# Patient Record
Sex: Female | Born: 1940 | Race: White | Hispanic: No | Marital: Married | Smoking: Never smoker
Health system: Southern US, Community
[De-identification: ages and names within clinical notes are randomized; demographics above are authoritative.]

## PROBLEM LIST (undated history)

## (undated) DIAGNOSIS — T4145XA Adverse effect of unspecified anesthetic, initial encounter: Secondary | ICD-10-CM

## (undated) DIAGNOSIS — K219 Gastro-esophageal reflux disease without esophagitis: Secondary | ICD-10-CM

## (undated) DIAGNOSIS — M8580 Other specified disorders of bone density and structure, unspecified site: Secondary | ICD-10-CM

## (undated) DIAGNOSIS — R52 Pain, unspecified: Secondary | ICD-10-CM

## (undated) DIAGNOSIS — E119 Type 2 diabetes mellitus without complications: Secondary | ICD-10-CM

## (undated) DIAGNOSIS — E785 Hyperlipidemia, unspecified: Secondary | ICD-10-CM

## (undated) DIAGNOSIS — H9190 Unspecified hearing loss, unspecified ear: Secondary | ICD-10-CM

## (undated) DIAGNOSIS — Z86010 Personal history of colonic polyps: Secondary | ICD-10-CM

## (undated) DIAGNOSIS — I10 Essential (primary) hypertension: Secondary | ICD-10-CM

## (undated) DIAGNOSIS — F419 Anxiety disorder, unspecified: Secondary | ICD-10-CM

## (undated) DIAGNOSIS — M1990 Unspecified osteoarthritis, unspecified site: Secondary | ICD-10-CM

## (undated) DIAGNOSIS — K5790 Diverticulosis of intestine, part unspecified, without perforation or abscess without bleeding: Secondary | ICD-10-CM

## (undated) DIAGNOSIS — K760 Fatty (change of) liver, not elsewhere classified: Secondary | ICD-10-CM

## (undated) DIAGNOSIS — T8859XA Other complications of anesthesia, initial encounter: Secondary | ICD-10-CM

## (undated) HISTORY — PX: JOINT REPLACEMENT: SHX530

## (undated) HISTORY — PX: TONSILLECTOMY: SUR1361

## (undated) HISTORY — DX: Hyperlipidemia, unspecified: E78.5

## (undated) HISTORY — DX: Unspecified hearing loss, unspecified ear: H91.90

## (undated) HISTORY — DX: Personal history of colonic polyps: Z86.010

## (undated) HISTORY — PX: APPENDECTOMY: SHX54

## (undated) HISTORY — DX: Other specified disorders of bone density and structure, unspecified site: M85.80

## (undated) HISTORY — DX: Fatty (change of) liver, not elsewhere classified: K76.0

## (undated) HISTORY — DX: Diverticulosis of intestine, part unspecified, without perforation or abscess without bleeding: K57.90

## (undated) HISTORY — DX: Anxiety disorder, unspecified: F41.9

## (undated) HISTORY — PX: ABDOMINAL HYSTERECTOMY: SHX81

## (undated) HISTORY — DX: Type 2 diabetes mellitus without complications: E11.9

---

## 1998-05-01 ENCOUNTER — Ambulatory Visit: Admission: RE | Admit: 1998-05-01 | Discharge: 1998-05-01 | Payer: Self-pay | Admitting: Internal Medicine

## 2001-04-15 ENCOUNTER — Other Ambulatory Visit: Admission: RE | Admit: 2001-04-15 | Discharge: 2001-04-15 | Payer: Self-pay | Admitting: Obstetrics & Gynecology

## 2002-04-18 ENCOUNTER — Other Ambulatory Visit: Admission: RE | Admit: 2002-04-18 | Discharge: 2002-04-18 | Payer: Self-pay | Admitting: Obstetrics & Gynecology

## 2004-05-26 ENCOUNTER — Other Ambulatory Visit: Admission: RE | Admit: 2004-05-26 | Discharge: 2004-05-26 | Payer: Self-pay | Admitting: Obstetrics & Gynecology

## 2006-08-26 ENCOUNTER — Ambulatory Visit: Payer: Self-pay | Admitting: Internal Medicine

## 2006-09-29 ENCOUNTER — Ambulatory Visit: Payer: Self-pay | Admitting: Internal Medicine

## 2009-08-02 ENCOUNTER — Inpatient Hospital Stay (HOSPITAL_COMMUNITY): Admission: RE | Admit: 2009-08-02 | Discharge: 2009-08-06 | Payer: Self-pay | Admitting: Orthopedic Surgery

## 2010-01-24 ENCOUNTER — Inpatient Hospital Stay (HOSPITAL_COMMUNITY): Admission: EM | Admit: 2010-01-24 | Discharge: 2010-01-30 | Payer: Self-pay | Admitting: Emergency Medicine

## 2010-03-10 ENCOUNTER — Inpatient Hospital Stay (HOSPITAL_COMMUNITY): Admission: RE | Admit: 2010-03-10 | Discharge: 2010-03-13 | Payer: Self-pay | Admitting: Orthopedic Surgery

## 2010-04-17 ENCOUNTER — Encounter: Admission: RE | Admit: 2010-04-17 | Discharge: 2010-04-17 | Payer: Self-pay | Admitting: Internal Medicine

## 2010-04-17 DIAGNOSIS — K760 Fatty (change of) liver, not elsewhere classified: Secondary | ICD-10-CM

## 2010-04-17 HISTORY — DX: Fatty (change of) liver, not elsewhere classified: K76.0

## 2010-08-21 LAB — URINALYSIS, ROUTINE W REFLEX MICROSCOPIC
Bilirubin Urine: NEGATIVE
Glucose, UA: 100 mg/dL — AB
Glucose, UA: 100 mg/dL — AB
Hgb urine dipstick: NEGATIVE
Ketones, ur: NEGATIVE mg/dL
Nitrite: NEGATIVE
Specific Gravity, Urine: 1.013 (ref 1.005–1.030)
Specific Gravity, Urine: 1.025 (ref 1.005–1.030)
Urobilinogen, UA: 0.2 mg/dL (ref 0.0–1.0)
pH: 7.5 (ref 5.0–8.0)
pH: 6.5 (ref 5.0–8.0)

## 2010-08-21 LAB — BASIC METABOLIC PANEL
BUN: 7 mg/dL (ref 6–23)
CO2: 26 mEq/L (ref 19–32)
CO2: 26 mEq/L (ref 19–32)
Calcium: 8.5 mg/dL (ref 8.4–10.5)
Chloride: 106 mEq/L (ref 96–112)
Chloride: 107 mEq/L (ref 96–112)
Creatinine, Ser: 0.72 mg/dL (ref 0.4–1.2)
Glucose, Bld: 104 mg/dL — ABNORMAL HIGH (ref 70–99)
Sodium: 139 mEq/L (ref 135–145)

## 2010-08-21 LAB — CBC
HCT: 31.9 % — ABNORMAL LOW (ref 36.0–46.0)
Hemoglobin: 10.9 g/dL — ABNORMAL LOW (ref 12.0–15.0)
Hemoglobin: 11 g/dL — ABNORMAL LOW (ref 12.0–15.0)
Hemoglobin: 14.4 g/dL (ref 12.0–15.0)
MCH: 32.8 pg (ref 26.0–34.0)
MCH: 33.7 pg (ref 26.0–34.0)
MCHC: 34.8 g/dL (ref 30.0–36.0)
MCV: 94.3 fL (ref 78.0–100.0)
MCV: 96.9 fL (ref 78.0–100.0)
Platelets: 210 10*3/uL (ref 150–400)
Platelets: 234 10*3/uL (ref 150–400)
Platelets: 343 10*3/uL (ref 150–400)
RBC: 3.35 MIL/uL — ABNORMAL LOW (ref 3.87–5.11)
RBC: 3.38 MIL/uL — ABNORMAL LOW (ref 3.87–5.11)
RBC: 4.45 MIL/uL (ref 3.87–5.11)
RDW: 13.3 % (ref 11.5–15.5)
WBC: 8.9 10*3/uL (ref 4.0–10.5)
WBC: 8.5 10*3/uL (ref 4.0–10.5)

## 2010-08-21 LAB — COMPREHENSIVE METABOLIC PANEL
ALT: 21 U/L (ref 0–35)
AST: 19 U/L (ref 0–37)
Albumin: 3.8 g/dL (ref 3.5–5.2)
CO2: 25 mEq/L (ref 19–32)
Calcium: 9.3 mg/dL (ref 8.4–10.5)
Chloride: 104 mEq/L (ref 96–112)
Creatinine, Ser: 0.78 mg/dL (ref 0.4–1.2)
GFR calc Af Amer: >60 mL/min (ref >60)
GFR calc non Af Amer: >60 mL/min (ref >60)
Sodium: 138 mEq/L (ref 135–145)
Total Bilirubin: 0.4 mg/dL (ref 0.3–1.2)

## 2010-08-21 LAB — PROTIME-INR
INR: 1.55 — ABNORMAL HIGH (ref 0.00–1.49)
Prothrombin Time: 18.8 seconds — ABNORMAL HIGH (ref 11.6–15.2)
Prothrombin Time: 20.4 seconds — ABNORMAL HIGH (ref 11.6–15.2)

## 2010-08-21 LAB — URINE MICROSCOPIC-ADD ON

## 2010-08-21 LAB — SURGICAL PCR SCREEN: MRSA, PCR: NEGATIVE

## 2010-08-21 LAB — APTT: aPTT: 27 seconds (ref 24–37)

## 2010-08-22 LAB — CBC
Platelets: 287 10*3/uL (ref 150–400)
RBC: 4.75 MIL/uL (ref 3.87–5.11)
RDW: 13 % (ref 11.5–15.5)
WBC: 14.5 10*3/uL — ABNORMAL HIGH (ref 4.0–10.5)

## 2010-08-22 LAB — PROTIME-INR
INR: 0.93 (ref 0.00–1.49)
Prothrombin Time: 12.7 seconds (ref 11.6–15.2)

## 2010-08-28 LAB — APTT: aPTT: 25 seconds (ref 24–37)

## 2010-08-28 LAB — URINALYSIS, ROUTINE W REFLEX MICROSCOPIC
Ketones, ur: NEGATIVE mg/dL
Nitrite: NEGATIVE
Protein, ur: NEGATIVE mg/dL
Urobilinogen, UA: 0.2 mg/dL (ref 0.0–1.0)

## 2010-08-28 LAB — PROTIME-INR
INR: 0.92 (ref 0.00–1.49)
INR: 1.58 — ABNORMAL HIGH (ref 0.00–1.49)
Prothrombin Time: 18.7 seconds — ABNORMAL HIGH (ref 11.6–15.2)

## 2010-08-28 LAB — BASIC METABOLIC PANEL
BUN: 6 mg/dL (ref 6–23)
CO2: 27 mEq/L (ref 19–32)
Calcium: 8.8 mg/dL (ref 8.4–10.5)
Chloride: 103 mEq/L (ref 96–112)
Creatinine, Ser: 0.78 mg/dL (ref 0.4–1.2)
Glucose, Bld: 114 mg/dL — ABNORMAL HIGH (ref 70–99)
Glucose, Bld: 204 mg/dL — ABNORMAL HIGH (ref 70–99)
Potassium: 3.5 mEq/L (ref 3.5–5.1)

## 2010-08-28 LAB — CBC
HCT: 34.7 % — ABNORMAL LOW (ref 36.0–46.0)
HCT: 38.7 % (ref 36.0–46.0)
HCT: 41.5 % (ref 36.0–46.0)
Hemoglobin: 11.8 g/dL — ABNORMAL LOW (ref 12.0–15.0)
MCHC: 34.7 g/dL (ref 30.0–36.0)
MCHC: 34.7 g/dL (ref 30.0–36.0)
MCV: 94.8 fL (ref 78.0–100.0)
Platelets: 199 10*3/uL (ref 150–400)
Platelets: 225 10*3/uL (ref 150–400)
Platelets: 259 10*3/uL (ref 150–400)
RDW: 12.6 % (ref 11.5–15.5)
RDW: 12.7 % (ref 11.5–15.5)
RDW: 12.8 % (ref 11.5–15.5)
RDW: 12.8 % (ref 11.5–15.5)
WBC: 6.3 10*3/uL (ref 4.0–10.5)

## 2010-08-28 LAB — COMPREHENSIVE METABOLIC PANEL
Albumin: 3.5 g/dL (ref 3.5–5.2)
BUN: 15 mg/dL (ref 6–23)
Chloride: 107 mEq/L (ref 96–112)
Creatinine, Ser: 0.74 mg/dL (ref 0.4–1.2)
GFR calc non Af Amer: >60 mL/min (ref >60)
Total Bilirubin: 0.5 mg/dL (ref 0.3–1.2)

## 2010-09-01 LAB — PROTIME-INR: Prothrombin Time: 21.3 seconds — ABNORMAL HIGH (ref 11.6–15.2)

## 2012-04-26 ENCOUNTER — Other Ambulatory Visit: Payer: Self-pay | Admitting: Orthopedic Surgery

## 2012-04-26 MED ORDER — DEXAMETHASONE SODIUM PHOSPHATE 10 MG/ML IJ SOLN: 10.0000 mg | Freq: Once | INTRAMUSCULAR | Status: DC

## 2012-04-26 NOTE — Progress Notes (Signed)
Preoperative surgical orders have been place into the Epic hospital system for Orland Dec on 04/26/2012, 11:23 AM  by Patrica Duel for surgery on 05/25/12.  Preop Knee Scope orders including IV Tylenol and IV Decadron as long as there are no contraindications to the above medications. Avel Peace, PA-C

## 2012-05-23 ENCOUNTER — Encounter (HOSPITAL_COMMUNITY): Payer: Self-pay

## 2012-05-23 ENCOUNTER — Encounter (HOSPITAL_COMMUNITY)
Admission: RE | Admit: 2012-05-23 | Discharge: 2012-05-23 | Disposition: A | Discharge status: Home / Self Care | Payer: Medicare Other | Source: Ambulatory Visit | Attending: Orthopedic Surgery | Admitting: Orthopedic Surgery

## 2012-05-23 ENCOUNTER — Encounter (HOSPITAL_COMMUNITY): Payer: Self-pay | Admitting: Pharmacy Technician

## 2012-05-23 HISTORY — DX: Other complications of anesthesia, initial encounter: T88.59XA

## 2012-05-23 HISTORY — DX: Unspecified osteoarthritis, unspecified site: M19.90

## 2012-05-23 HISTORY — DX: Pain, unspecified: R52

## 2012-05-23 HISTORY — DX: Adverse effect of unspecified anesthetic, initial encounter: T41.45XA

## 2012-05-23 HISTORY — DX: Gastro-esophageal reflux disease without esophagitis: K21.9

## 2012-05-23 HISTORY — DX: Essential (primary) hypertension: I10

## 2012-05-23 LAB — BASIC METABOLIC PANEL WITH GFR
BUN: 19 mg/dL (ref 6–23)
CO2: 26 meq/L (ref 19–32)
Calcium: 10.4 mg/dL (ref 8.4–10.5)
Chloride: 101 meq/L (ref 96–112)
Creatinine, Ser: 0.75 mg/dL (ref 0.50–1.10)
GFR calc Af Amer: >90 mL/min
GFR calc non Af Amer: 82 mL/min — ABNORMAL LOW
Glucose, Bld: 106 mg/dL — ABNORMAL HIGH (ref 70–99)
Potassium: 3.7 meq/L (ref 3.5–5.1)
Sodium: 139 meq/L (ref 135–145)

## 2012-05-23 LAB — SURGICAL PCR SCREEN
MRSA, PCR: NEGATIVE
Staphylococcus aureus: POSITIVE — AB

## 2012-05-23 NOTE — Patient Instructions (Signed)
YOUR SURGERY IS SCHEDULED AT [facility]  ON:  WED  12/18  REPORT TO [facility] AT:  11:00 AM      PHONE # FOR SHORT STAY IS [phone]  DO NOT EAT  ANYTHING AFTER MIDNIGHT THE NIGHT BEFORE YOUR SURGERY.  NO FOOD, NO CHEWING GUM, NO MINTS, NO CANDIES, NO CHEWING TOBACCO. YOU MAY HAVE CLEAR LIQUIDS TO DRINK FROM MIDNIGHT THE NIGHT BEFORE SURGERY UNTIL 7:30 AM DAY OF SURGERY --LIKE WATER, TEA, APPLE, GRAPE OR CRANBERRY JUICE.  NOTHING TO DRINK AFTER 7:30 AM.  PLEASE TAKE THE FOLLOWING MEDICATIONS THE AM OF YOUR SURGERY WITH A FEW SIPS OF WATER:   AMLODIPINE AND PRAVASTATIN.  IF YOU USE INHALERS--USE YOUR INHALERS THE AM OF YOUR SURGERY AND BRING INHALERS TO THE HOSPITAL -TAKE TO SURGERY.    IF YOU ARE DIABETIC:  DO NOT TAKE ANY DIABETIC MEDICATIONS THE AM OF YOUR SURGERY.  IF YOU TAKE INSULIN IN THE EVENINGS--PLEASE ONLY TAKE 1/2 NORMAL EVENING DOSE THE NIGHT BEFORE YOUR SURGERY.  NO INSULIN THE AM OF YOUR SURGERY.  IF YOU HAVE SLEEP APNEA AND USE CPAP OR BIPAP--PLEASE BRING THE MASK AND THE TUBING.  DO NOT BRING YOUR MACHINE.  DO NOT BRING VALUABLES, MONEY, CREDIT CARDS.  DO NOT WEAR JEWELRY, MAKE-UP, NAIL POLISH AND NO METAL PINS OR CLIPS IN YOUR HAIR. CONTACT LENS, DENTURES / PARTIALS, GLASSES SHOULD NOT BE WORN TO SURGERY AND IN MOST CASES-HEARING AIDS WILL NEED TO BE REMOVED.  BRING YOUR GLASSES CASE, ANY EQUIPMENT NEEDED FOR YOUR CONTACT LENS. FOR PATIENTS ADMITTED TO THE HOSPITAL--CHECK OUT TIME THE DAY OF DISCHARGE IS 11:00 AM.  ALL INPATIENT ROOMS ARE PRIVATE - WITH BATHROOM, TELEPHONE, TELEVISION AND WIFI INTERNET.  IF YOU ARE BEING DISCHARGED THE SAME DAY OF YOUR SURGERY--YOU CAN NOT DRIVE YOURSELF HOME--AND SHOULD NOT GO HOME ALONE BY TAXI OR BUS.  NO DRIVING OR OPERATING MACHINERY FOR 24 HOURS FOLLOWING ANESTHESIA / PAIN MEDICATIONS.  PLEASE MAKE ARRANGEMENTS FOR SOMEONE TO BE WITH YOU AT HOME THE FIRST 24 HOURS AFTER SURGERY. RESPONSIBLE DRIVER'S NAME   TED Nicol   [phone]                                                                            PLEASE READ OVER ANY  FACT SHEETS THAT YOU WERE GIVEN: MRSA INFORMATION, BLOOD TRANSFUSION INFORMATION, INCENTIVE SPIROMETER INFORMATION. FAILURE TO FOLLOW THESE INSTRUCTIONS MAY RESULT IN THE CANCELLATION OF YOUR SURGERY.   PATIENT SIGNATURE_________________________________

## 2012-05-23 NOTE — Pre-Procedure Instructions (Addendum)
PT HAS CXR REPORT FROM DR. TISOVEC DONE 11/13/11-REPORT ON HER CHART. CBC, BMET WERE DONE PREOP TODAY AT [facility] - AS PER ANESTHESIOLOGIST'S GUIDELINES. PT STATES EKG WAS ALSO DONE AT DR. TISOVEC'S OFFICE ON 11/13/11--REPORT WILL BE REQUESTED--DR. TISOVEC'S OFFICE SENT AN EKG THAT WAS DONE 11/07/10.  EKG NOT REPEATED TODAY.

## 2012-05-24 NOTE — H&P (Signed)
CC- Paige [name] is a 71 y.o. female who presents with left knee pain.  HPI- . Knee Pain: Patient presents with knee pain involving the  left knee. Onset of the symptoms was several months ago. Inciting event: none known. Current symptoms include crepitus sensation. Pain is aggravated by going up and down stairs and rising after sitting.  Patient has had prior knee problems. Evaluation to date: plain films: normal. Treatment to date: none.  Past Medical History  Diagnosis Date  . Hypertension   . GERD (gastroesophageal reflux disease)     PAST HX--NO PROBLEMS NOW-WATCHES DIET  . Arthritis   . Pain     LEFT KNEE  . Complication of anesthesia     PT STATES THAT SHE WAS ADVISED NOT TO HAVE SPINAL ANESTHESIA FOR HER SECOND KNEE REPLACEMENT SURGERY IN  2011-BECAUSE SHE HAD BACK PROBLEMS-PAIN AND SEVERAL EPIDURALS--STATES DR. Shon Baton ADVISED AGAINST SPINAL .  PT STATES SHE HAD SORE THROAT FOR 3 DAYS AFTER THE GENERAL ANESTHESIA FOR  LAST KNEE REPLACEMENT    Past Surgical History  Procedure Date  . Joint replacement     BILATERAL KNEE REPLACEMENTS  . Abdominal hysterectomy   . Appendectomy   . Tonsillectomy     AND ADNOIDECTOMY    Prior to Admission medications   Medication Sig Start Date End Date Taking? Authorizing Provider  amLODipine (NORVASC) 5 MG tablet Take 5 mg by mouth daily before breakfast.    Historical Provider, MD  aspirin EC 81 MG tablet Take 81 mg by mouth daily.    Historical Provider, MD  B Complex-C (B-COMPLEX WITH VITAMIN C) tablet Take 1 tablet by mouth daily.    Historical Provider, MD  calcium-vitamin D (OSCAL WITH D) 500-200 MG-UNIT per tablet Take 1 tablet by mouth daily.    Historical Provider, MD  co-enzyme Q-10 30 MG capsule Take 30 mg by mouth daily.     Historical Provider, MD  diphenhydrAMINE (BENADRYL) 25 MG tablet Take 25 mg by mouth. ONE EVERY NIGHT    Historical Provider, MD  diphenhydrAMINE (SOMINEX) 25 MG tablet Take 25 mg by mouth at bedtime as  needed.    Historical Provider, MD  glucosamine-chondroitin 500-400 MG tablet Take 1 tablet by mouth daily.    Historical Provider, MD  Multiple Vitamin (MULTIVITAMIN WITH MINERALS) TABS Take 1 tablet by mouth daily.    Historical Provider, MD  naproxen sodium (ANAPROX) 220 MG tablet Take 440 mg by mouth at bedtime.    Historical Provider, MD  Omega-3 Fatty Acids (FISH OIL) 1200 MG CAPS Take 1 capsule by mouth daily.    Historical Provider, MD  pravastatin (PRAVACHOL) 40 MG tablet Take 40 mg by mouth daily before breakfast.    Historical Provider, MD  psyllium (METAMUCIL) 58.6 % powder Take 1 packet by mouth. EVERY NIGHT    Historical Provider, MD  raloxifene (EVISTA) 60 MG tablet Take 60 mg by mouth daily before breakfast.    Historical Provider, MD  valsartan-hydrochlorothiazide (DIOVAN-HCT) 320-25 MG per tablet Take 1 tablet by mouth daily before breakfast.    Historical Provider, MD  vitamin C (ASCORBIC ACID) 500 MG tablet Take 500 mg by mouth daily.    Historical Provider, MD  vitamin E 400 UNIT capsule Take 400 Units by mouth daily.    Historical Provider, MD   KNEE EXAM antalgic gait, crepitus on range of motion, range 0-125  Physical Examination: General appearance - alert, well appearing, and in no distress Mental status - alert, oriented  to person, place, and time Chest - clear to auscultation, no wheezes, rales or rhonchi, symmetric air entry Heart - normal rate, regular rhythm, normal S1, S2, no murmurs, rubs, clicks or gallops Abdomen - soft, nontender, nondistended, no masses or organomegaly Neurological - alert, oriented, normal speech, no focal findings or movement disorder noted   Asessment/Plan--- Left knee hypertrophic synovitis- - Plan left knee arthroscopy with synovectomy. Procedure risks and potential comps discussed with patient who elects to proceed. Goals are decreased pain and increased function with a high likelihood of achieving both

## 2012-05-24 NOTE — Pre-Procedure Instructions (Addendum)
ANITA IN MEDICAL RECORDS WITH DR. TISOVEC'S OFFICE STATES OFFICE NOTE 11/13/11 SHOWS PT DID HAVE EKG - BUT THE ACTUAL EKG TRACING NOT IN PT'S CHART.  THE OFFICE NOTE OF 11/13/11 WAS FAXED AND IS ON PT'S SURGICAL CHART--THE OFFICE NOTE SAYS EKG FINDINGS REVIEWED WITH PT --NO OTHER INFO ABOUT EKG IN THE NOTES.

## 2012-05-25 ENCOUNTER — Encounter (HOSPITAL_COMMUNITY): Payer: Self-pay | Admitting: *Deleted

## 2012-05-25 ENCOUNTER — Encounter (HOSPITAL_COMMUNITY): Payer: Self-pay | Admitting: Anesthesiology

## 2012-05-25 ENCOUNTER — Ambulatory Visit (HOSPITAL_COMMUNITY): Payer: Medicare Other | Admitting: Anesthesiology

## 2012-05-25 ENCOUNTER — Encounter (HOSPITAL_COMMUNITY)
Admission: RE | Disposition: A | Discharge status: Home / Self Care | Payer: Self-pay | Source: Ambulatory Visit | Attending: Orthopedic Surgery

## 2012-05-25 ENCOUNTER — Ambulatory Visit (HOSPITAL_COMMUNITY)
Admission: RE | Admit: 2012-05-25 | Discharge: 2012-05-25 | Disposition: A | Discharge status: Home / Self Care | Payer: Medicare Other | Source: Ambulatory Visit | Attending: Orthopedic Surgery | Admitting: Orthopedic Surgery

## 2012-05-25 DIAGNOSIS — M659 Unspecified synovitis and tenosynovitis, unspecified site: Secondary | ICD-10-CM | POA: Insufficient documentation

## 2012-05-25 DIAGNOSIS — Z79899 Other long term (current) drug therapy: Secondary | ICD-10-CM | POA: Insufficient documentation

## 2012-05-25 DIAGNOSIS — I10 Essential (primary) hypertension: Secondary | ICD-10-CM | POA: Insufficient documentation

## 2012-05-25 DIAGNOSIS — K219 Gastro-esophageal reflux disease without esophagitis: Secondary | ICD-10-CM | POA: Insufficient documentation

## 2012-05-25 DIAGNOSIS — Z96659 Presence of unspecified artificial knee joint: Secondary | ICD-10-CM | POA: Insufficient documentation

## 2012-05-25 DIAGNOSIS — Z7982 Long term (current) use of aspirin: Secondary | ICD-10-CM | POA: Insufficient documentation

## 2012-05-25 DIAGNOSIS — Z01812 Encounter for preprocedural laboratory examination: Secondary | ICD-10-CM | POA: Insufficient documentation

## 2012-05-25 HISTORY — PX: KNEE ARTHROSCOPY: SHX127

## 2012-05-25 LAB — CBC
HCT: 40.5 % (ref 36.0–46.0)
MCHC: 35.3 g/dL (ref 30.0–36.0)
MCV: 89 fL (ref 78.0–100.0)
RDW: 12.4 % (ref 11.5–15.5)

## 2012-05-25 SURGERY — ARTHROSCOPY, KNEE
Anesthesia: General | Site: Knee | Laterality: Left | Wound class: Clean

## 2012-05-25 MED ORDER — FENTANYL CITRATE 0.05 MG/ML IJ SOLN: 25.0000 ug | INTRAMUSCULAR | Status: DC | PRN

## 2012-05-25 MED ORDER — PROPOFOL 10 MG/ML IV BOLUS
INTRAVENOUS | Status: DC | PRN
  Administered 2012-05-25: 200 mg via INTRAVENOUS

## 2012-05-25 MED ORDER — ACETAMINOPHEN 10 MG/ML IV SOLN
INTRAVENOUS | Status: AC
  Filled 2012-05-25: qty 100

## 2012-05-25 MED ORDER — CEFAZOLIN SODIUM-DEXTROSE 2-3 GM-% IV SOLR
2.0000 g | INTRAVENOUS | Status: AC
  Administered 2012-05-25: 2 g via INTRAVENOUS

## 2012-05-25 MED ORDER — LACTATED RINGERS IV SOLN
INTRAVENOUS | Status: DC | PRN
  Administered 2012-05-25 (×2): via INTRAVENOUS

## 2012-05-25 MED ORDER — CEFAZOLIN SODIUM-DEXTROSE 2-3 GM-% IV SOLR
INTRAVENOUS | Status: AC
  Filled 2012-05-25: qty 50

## 2012-05-25 MED ORDER — BUPIVACAINE-EPINEPHRINE PF 0.25-1:200000 % IJ SOLN
INTRAMUSCULAR | Status: AC
  Filled 2012-05-25: qty 30

## 2012-05-25 MED ORDER — HYDROMORPHONE HCL 2 MG PO TABS: 2.0000 mg | ORAL_TABLET | ORAL | Status: DC | PRN

## 2012-05-25 MED ORDER — SODIUM CHLORIDE 0.9 % IV SOLN: INTRAVENOUS | Status: DC

## 2012-05-25 MED ORDER — ONDANSETRON HCL 4 MG/2ML IJ SOLN
INTRAMUSCULAR | Status: DC | PRN
  Administered 2012-05-25: 4 mg via INTRAVENOUS

## 2012-05-25 MED ORDER — METHOCARBAMOL 500 MG PO TABS: 500.0000 mg | ORAL_TABLET | Freq: Four times a day (QID) | ORAL | Status: DC

## 2012-05-25 MED ORDER — FENTANYL CITRATE 0.05 MG/ML IJ SOLN
INTRAMUSCULAR | Status: DC | PRN
  Administered 2012-05-25: 50 ug via INTRAVENOUS
  Administered 2012-05-25: 25 ug via INTRAVENOUS
  Administered 2012-05-25: 50 ug via INTRAVENOUS

## 2012-05-25 MED ORDER — LACTATED RINGERS IR SOLN
Status: DC | PRN
  Administered 2012-05-25: 3000 mL

## 2012-05-25 MED ORDER — CHLORHEXIDINE GLUCONATE 4 % EX LIQD: 60.0000 mL | Freq: Once | CUTANEOUS | Status: DC

## 2012-05-25 MED ORDER — LIDOCAINE HCL (CARDIAC) 20 MG/ML IV SOLN
INTRAVENOUS | Status: DC | PRN
  Administered 2012-05-25: 50 mg via INTRAVENOUS

## 2012-05-25 MED ORDER — PROMETHAZINE HCL 25 MG/ML IJ SOLN: 6.2500 mg | INTRAMUSCULAR | Status: DC | PRN

## 2012-05-25 MED ORDER — ACETAMINOPHEN 10 MG/ML IV SOLN
1000.0000 mg | Freq: Once | INTRAVENOUS | Status: AC
  Administered 2012-05-25: 1000 mg via INTRAVENOUS

## 2012-05-25 MED ORDER — BUPIVACAINE-EPINEPHRINE 0.25% -1:200000 IJ SOLN
INTRAMUSCULAR | Status: DC | PRN
  Administered 2012-05-25: 30 mL

## 2012-05-25 MED ORDER — KETOROLAC TROMETHAMINE 30 MG/ML IJ SOLN: 15.0000 mg | Freq: Once | INTRAMUSCULAR | Status: DC | PRN

## 2012-05-25 MED ORDER — MIDAZOLAM HCL 5 MG/5ML IJ SOLN
INTRAMUSCULAR | Status: DC | PRN
  Administered 2012-05-25: 2 mg via INTRAVENOUS

## 2012-05-25 SURGICAL SUPPLY — 27 items
BLADE 4.2CUDA (BLADE) ×2 IMPLANT
BNDG COHESIVE 6X5 TAN STRL LF (GAUZE/BANDAGES/DRESSINGS) ×1 IMPLANT
CLOTH BEACON ORANGE TIMEOUT ST (SAFETY) ×2 IMPLANT
CUFF TOURN SGL QUICK 34 (TOURNIQUET CUFF) ×2
CUFF TRNQT CYL 34X4X40X1 (TOURNIQUET CUFF) ×1 IMPLANT
DRAPE U-SHAPE 47X51 STRL (DRAPES) ×2 IMPLANT
DRSG ADAPTIC 3X8 NADH LF (GAUZE/BANDAGES/DRESSINGS) ×1 IMPLANT
DRSG EMULSION OIL 3X3 NADH (GAUZE/BANDAGES/DRESSINGS) ×2 IMPLANT
DRSG PAD ABDOMINAL 8X10 ST (GAUZE/BANDAGES/DRESSINGS) ×2 IMPLANT
DURAPREP 26ML APPLICATOR (WOUND CARE) ×2 IMPLANT
GLOVE BIO SURGEON STRL SZ7.5 (GLOVE) ×1 IMPLANT
GLOVE BIO SURGEON STRL SZ8 (GLOVE) ×2 IMPLANT
GLOVE BIOGEL PI IND STRL 8 (GLOVE) ×1 IMPLANT
GLOVE BIOGEL PI INDICATOR 8 (GLOVE) ×1
GOWN STRL NON-REIN LRG LVL3 (GOWN DISPOSABLE) ×2 IMPLANT
MANIFOLD NEPTUNE II (INSTRUMENTS) ×4 IMPLANT
PACK ARTHROSCOPY WL (CUSTOM PROCEDURE TRAY) ×2 IMPLANT
PACK ICE MAXI GEL EZY WRAP (MISCELLANEOUS) ×6 IMPLANT
PADDING CAST COTTON 6X4 STRL (CAST SUPPLIES) ×2 IMPLANT
POSITIONER SURGICAL ARM (MISCELLANEOUS) ×2 IMPLANT
SET ARTHROSCOPY TUBING (MISCELLANEOUS) ×2
SET ARTHROSCOPY TUBING LN (MISCELLANEOUS) ×1 IMPLANT
SPONGE GAUZE 4X4 12PLY (GAUZE/BANDAGES/DRESSINGS) ×1 IMPLANT
SUT ETHILON 4 0 PS 2 18 (SUTURE) ×2 IMPLANT
TOWEL OR 17X26 10 PK STRL BLUE (TOWEL DISPOSABLE) ×2 IMPLANT
WAND 90 DEG TURBOVAC W/CORD (SURGICAL WAND) ×2 IMPLANT
WRAP KNEE MAXI GEL POST OP (GAUZE/BANDAGES/DRESSINGS) ×4 IMPLANT

## 2012-05-25 NOTE — Transfer of Care (Signed)
Immediate Anesthesia Transfer of Care Note  Patient: Paige [name]  Procedure(s) Performed: Procedure(s) (LRB) with comments: ARTHROSCOPY KNEE (Left) - SYNOVECTOMY  Patient Location: PACU  Anesthesia Type:General  Level of Consciousness: awake and alert   Airway & Oxygen Therapy: Patient Spontanous Breathing and Patient connected to face mask oxygen  Post-op Assessment: Report given to PACU RN and Post -op Vital signs reviewed and unstable, Anesthesiologist notified  Post vital signs: Reviewed and stable  Complications: No apparent anesthesia complications

## 2012-05-25 NOTE — Anesthesia Postprocedure Evaluation (Signed)
  Anesthesia Post-op Note  Patient: Paige [name]  Procedure(s) Performed: Procedure(s) (LRB): ARTHROSCOPY KNEE (Left)  Patient Location: PACU  Anesthesia Type: General  Level of Consciousness: awake and alert   Airway and Oxygen Therapy: Patient Spontanous Breathing  Post-op Pain: mild  Post-op Assessment: Post-op Vital signs reviewed, Patient's Cardiovascular Status Stable, Respiratory Function Stable, Patent Airway and No signs of Nausea or vomiting  Last Vitals:  Filed Vitals:   05/25/12 1430  BP: 140/57  Pulse: 72  Temp:   Resp: 9    Post-op Vital Signs: stable   Complications: No apparent anesthesia complications

## 2012-05-25 NOTE — Interval H&P Note (Signed)
History and Physical Interval Note:  05/25/2012 1:06 PM  Paige [name]  has presented today for surgery, with the diagnosis of LEFT KNEE HYPERTROPHIC  SYNOVECTITIS    The various methods of treatment have been discussed with the patient and family. After consideration of risks, benefits and other options for treatment, the patient has consented to  Procedure(s) (LRB) with comments: ARTHROSCOPY KNEE (Left) - SYNOVECTOMY as a surgical intervention .  The patient's history has been reviewed, patient examined, no change in status, stable for surgery.  I have reviewed the patient's chart and labs.  Questions were answered to the patient's satisfaction.     Loanne Drilling

## 2012-05-25 NOTE — Brief Op Note (Signed)
05/25/2012  4:03 PM  PATIENT:  Orland Dec  71 y.o. female  PRE-OPERATIVE DIAGNOSIS:  LEFT KNEE HYPERTROPHIC  SYNOVITIS    POST-OPERATIVE DIAGNOSIS:  left  knee hypertrophic synovitis  PROCEDURE:  Procedure(s) (LRB) with comments: ARTHROSCOPY KNEE (Left) - SYNOVECTOMY  SURGEON:  Surgeon(s) and Role:    * Loanne Drilling, MD - Primary  PHYSICIAN ASSISTANT:   ASSISTANTS: none   ANESTHESIA:   general  EBL:  Total I/O In: 1325 [P.O.:225; I.V.:1100] Out: -   BLOOD ADMINISTERED:none  DRAINS: none   LOCAL MEDICATIONS USED:  MARCAINE     COUNTS:  YES  TOURNIQUET:  * Missing tourniquet times found for documented tourniquets in log:  70255 *  DICTATION: .Other Dictation: Dictation Number [id number]  PLAN OF CARE: Discharge to home after PACU  PATIENT DISPOSITION:  PACU - hemodynamically stable.

## 2012-05-25 NOTE — Anesthesia Preprocedure Evaluation (Signed)
Anesthesia Evaluation  Patient identified by MRN, date of birth, ID band Patient awake    Reviewed: Allergy & Precautions, H&P , NPO status , Patient's Chart, lab work & pertinent test results  Airway Mallampati: III TM Distance: <3 FB Neck ROM: Full    Dental No notable dental hx.    Pulmonary neg pulmonary ROS,  breath sounds clear to auscultation  Pulmonary exam normal       Cardiovascular hypertension, Pt. on medications Rhythm:Regular Rate:Normal     Neuro/Psych negative neurological ROS  negative psych ROS   GI/Hepatic Neg liver ROS,   Endo/Other  negative endocrine ROS  Renal/GU negative Renal ROS  negative genitourinary   Musculoskeletal negative musculoskeletal ROS (+)   Abdominal   Peds negative pediatric ROS (+)  Hematology negative hematology ROS (+)   Anesthesia Other Findings   Reproductive/Obstetrics negative OB ROS                           Anesthesia Physical Anesthesia Plan  ASA: II  Anesthesia Plan: General   Post-op Pain Management:    Induction: Intravenous  Airway Management Planned: LMA  Additional Equipment:   Intra-op Plan:   Post-operative Plan:   Informed Consent: I have reviewed the patients History and Physical, chart, labs and discussed the procedure including the risks, benefits and alternatives for the proposed anesthesia with the patient or authorized representative who has indicated his/her understanding and acceptance.   Dental advisory given  Plan Discussed with: CRNA and Surgeon  Anesthesia Plan Comments:         Anesthesia Quick Evaluation

## 2012-05-26 ENCOUNTER — Encounter (HOSPITAL_COMMUNITY): Payer: Self-pay | Admitting: Orthopedic Surgery

## 2012-05-26 NOTE — Op Note (Signed)
[name], [name]             ACCOUNT NO.:  [id number]  MEDICAL RECORD NO.:  [id number]  LOCATION:  [facility]                         FACILITY:  [facility]  PHYSICIAN:  Ollen Gross, M.D.    DATE OF BIRTH:  1940/08/23  DATE OF PROCEDURE:  05/25/2012 DATE OF DISCHARGE:  05/25/2012                              OPERATIVE REPORT   PREOPERATIVE DIAGNOSIS:  Left knee hypertrophic synovitis.  POSTOPERATIVE DIAGNOSIS:  Left knee hypertrophic synovitis.  PROCEDURE:  Left knee arthroscopy with synovectomy.  SURGEON:  Ollen Gross, MD  ASSISTANT:  None.  ANESTHESIA:  General.  ESTIMATED BLOOD LOSS:  Minimal.  DRAINS:  None.  COMPLICATIONS:  None.  CONDITION:  Stable to recovery.  BRIEF CLINICAL NOTE:  Paige [name] is a 71 year old female, had a total knee arthroplasty performed several years ago.  Did well and then started to have painful popping in the knee consistent with hypertrophic synovitis or the "patellar clunk syndrome."  She has failed nonoperative management, presents now for arthroscopy with synovectomy.  PROCEDURE IN DETAIL:  After successful administration of general anesthetic, a tourniquet was placed high on the left thigh and left lower extremity, prepped and draped in the usual sterile fashion. Standard superomedial, inferolateral incisions were made, inflow cannula passed superomedial, camera passed inferolateral.  Arthroscopic visualization proceeds.  The components are in good position.  There is a lot of hypertrophic tissue in the suprapatellar pouch at the junction between the patellar component and quad tendon.  A knife was used to make an incision for the superolateral portal.  Then using a combination of the 4.2 mm shaver and the ArthroCare device, this hypertrophic tissue was debrided back to normal looking tissue effectively clearing out the entire suprapatellar space.  The joint was inspected further and there was no other abnormal tissue present.  No  loose bodies were present either.  I inspected throughout inferiorly and some scar tissue at the inferior aspect of the patella tendon, and that was also removed back to normal-appearing tissue.  I then removed the arthroscopic equipment from the lateral portals, which were closed with interrupted 4-0 nylon.  A 20 mL of 0.25% Marcaine with epi was injected through the inflow cannula then that was removed and that portal closed with nylon.  Bulky sterile dressing was then applied, and she was awakened and transported to recovery in stable condition.     Ollen Gross, M.D.     FA/MEDQ  D:  05/25/2012  T:  05/26/2012  Job:  [id number]

## 2012-06-29 ENCOUNTER — Telehealth: Payer: Self-pay | Admitting: Internal Medicine

## 2012-06-29 NOTE — Telephone Encounter (Signed)
Spoke with patient and she is in [place] until the first of March. She would like to book an appointment with Dr. Juanda Chance in March to discuss "food not going down when I eat." States she has had problems for several years. She went to her ENT for hearing problems and mentioned it and was told to see her GI. ENT did not evaluate her throat at all. Scheduled patient on August 16, 2012 at 11:00 AM with Dr. Juanda Chance

## 2012-07-08 ENCOUNTER — Encounter: Payer: Self-pay | Admitting: *Deleted

## 2012-08-16 ENCOUNTER — Encounter: Payer: Self-pay | Admitting: Internal Medicine

## 2012-08-16 ENCOUNTER — Ambulatory Visit (INDEPENDENT_AMBULATORY_CARE_PROVIDER_SITE_OTHER): Payer: Medicare Other | Admitting: Internal Medicine

## 2012-08-16 VITALS — BP 104/60 | HR 68 | Ht 61.0 in | Wt 145.8 lb

## 2012-08-16 DIAGNOSIS — K219 Gastro-esophageal reflux disease without esophagitis: Secondary | ICD-10-CM

## 2012-08-16 DIAGNOSIS — R1319 Other dysphagia: Secondary | ICD-10-CM

## 2012-08-16 NOTE — Patient Instructions (Addendum)
You have been scheduled for an endoscopy with propofol. Please follow written instructions given to you at your visit today. If you use inhalers (even only as needed), please bring them with you on the day of your procedure.  CC: Dr Guerry Bruin

## 2012-08-16 NOTE — Progress Notes (Signed)
Paige [name] Oct 14, 1940 MRN [id number]  History of Present Illness:  This is a 72 year old white female with a gradual onset of solid food dysphagia with several recent episodes of food impaction which passed spontaneously. During one episode, she had to expectorate her saliva for several hours before the food passed. The episodes occur with solid foods, especially meat and also with pills. She has a history of gastroesophageal reflux. She was treated about 3 years ago by Dr Wylene Simmer with acid reducing medication. She underwent an arthroscopy and synovectomy in December 2013. We did a colonoscopy in April 2008 which was a normal exam. A prior colonoscopy in 2003 showed an adenomatous polyp. Patient denies nocturnal cough or hoarseness. She does not smoke. She has been on Naproxen 220 mg 2 tablets at bedtime and Metamucil one packet a day.   Past Medical History  Diagnosis Date  . Hypertension   . GERD (gastroesophageal reflux disease)     PAST HX--NO PROBLEMS NOW-WATCHES DIET  . Arthritis   . Pain     LEFT KNEE  . Complication of anesthesia     PT STATES THAT SHE WAS ADVISED NOT TO HAVE SPINAL ANESTHESIA FOR HER SECOND KNEE REPLACEMENT SURGERY IN  2011-BECAUSE SHE HAD BACK PROBLEMS-PAIN AND SEVERAL EPIDURALS--STATES DR. Shon Baton ADVISED AGAINST SPINAL .  PT STATES SHE HAD SORE THROAT FOR 3 DAYS AFTER THE GENERAL ANESTHESIA FOR  LAST KNEE REPLACEMENT  . Hx of adenomatous colonic polyps   . Hepatic steatosis 04/17/10  . Diverticulosis     hyperlip  . Hyperlipemia    Past Surgical History  Procedure Laterality Date  . Joint replacement      BILATERAL KNEE REPLACEMENTS  . Abdominal hysterectomy    . Appendectomy    . Tonsillectomy      AND ADNOIDECTOMY  . Knee arthroscopy  05/25/2012    Procedure: ARTHROSCOPY KNEE;  Surgeon: Loanne Drilling, MD;  Location: [facility];  Service: Orthopedics;  Laterality: Left;  SYNOVECTOMY    reports that she has never smoked. She has never used smokeless  tobacco. She reports that she does not drink alcohol or use illicit drugs. family history includes Diabetes in her father.  There is no history of Colon cancer. Allergies  Allergen Reactions  . Chlorhexidine     ITCHING  . Hydrocodone     SEVERE NAUSEA  . Other     MANGOS -RASH LIKE POISON IVY CASHEWS- MOUTH SWELLS POISON IVY-RASH NOTE:  PT STATES SHE CAN TAKE TRAMADOL FOR PAIN  . Oxycodone     SEVERE NAUSEA  . Sulfa Antibiotics Hives    Face bright red        Review of Systems: Positive for solid food dysphagia. Occasional heartburn  The remainder of the 10 point ROS is negative except as outlined in H&P   Physical Exam: General appearance  Well developed, in no distress. Psychological normal mood and affect.  Assessment and Plan:  Problem #77 72 year old white female with progressive solid food dysphagia and pill dysphagia consistent with a benign distal esophageal stricture. There is a history of gastroesophageal reflux although only intermittently. She has no symptoms suggestive of aspiration or LPR. She is taking Naprosyn which may be contributing to the reflux. She is up-to-date on her colonoscopy. We have discussed an upper endoscopy, antireflux measures, sedation and the dilatation. I have given her samples of Nexium 40 mg to take for a few days prior to the procedure. We will be checking for Barrett's esophagus and  a hiatal hernia.   08/16/2012 Paige [name]

## 2012-08-30 ENCOUNTER — Encounter: Payer: Self-pay | Admitting: Internal Medicine

## 2012-08-30 ENCOUNTER — Ambulatory Visit (AMBULATORY_SURGERY_CENTER): Payer: Medicare Other | Admitting: Internal Medicine

## 2012-08-30 VITALS — BP 139/63 | HR 76 | Temp 98.0°F | Resp 32 | Ht 61.0 in | Wt 144.0 lb

## 2012-08-30 DIAGNOSIS — K209 Esophagitis, unspecified: Secondary | ICD-10-CM

## 2012-08-30 DIAGNOSIS — K222 Esophageal obstruction: Secondary | ICD-10-CM

## 2012-08-30 DIAGNOSIS — R1319 Other dysphagia: Secondary | ICD-10-CM

## 2012-08-30 MED ORDER — SODIUM CHLORIDE 0.9 % IV SOLN: 500.0000 mL | INTRAVENOUS | Status: DC

## 2012-08-30 MED ORDER — OMEPRAZOLE 20 MG PO CPDR: 20.0000 mg | DELAYED_RELEASE_CAPSULE | Freq: Every day | ORAL | Status: DC

## 2012-08-30 NOTE — Progress Notes (Signed)
Patient did not experience any of the following events: a burn prior to discharge; a fall within the facility; wrong site/side/patient/procedure/implant event; or a hospital transfer or hospital admission upon discharge from the facility. (G8907) Patient did not have preoperative order for IV antibiotic SSI prophylaxis. (G8918)  

## 2012-08-30 NOTE — Progress Notes (Signed)
Omeprazole 20 mg. Bid ,#120, 1 refill called to [facility] at [facility] by Clide Cliff RN, per order Dr. Juanda Chance.

## 2012-08-30 NOTE — Op Note (Signed)
[facility] [address] [place], [address]   ENDOSCOPY PROCEDURE REPORT  PATIENT: Paige [name], Paige [name]  MR#: [id number] BIRTHDATE: Dec 11, 1940 , 72  yrs. old GENDER: Female ENDOSCOPIST: Hart Carwin, MD REFERRED BY:  Guerry Bruin, M.D. PROCEDURE DATE:  08/30/2012 PROCEDURE:  EGD w/ biopsy and Savary dilation of esophagus ASA CLASS:     Class II INDICATIONS:  Dysphagia.   Heartburn.   History of esophageal reflux. MEDICATIONS: MAC sedation, administered by CRNA and propofol (Diprivan) 200mg  IV TOPICAL ANESTHETIC: Cetacaine Spray  DESCRIPTION OF PROCEDURE: After the risks benefits and alternatives of the procedure were thoroughly explained, informed consent was obtained.  [facility] [id number] endoscope was introduced through the mouth and advanced to the second portion of the duodenum. Without limitations.  The instrument was slowly withdrawn as the mucosa was fully examined.      mucosa of the proximal and mid esophagus appeared normal. There was a moderately severe fibrotic stricture at the GE junction at the level of 34 cm from the incisors, endoscope had to exert her pressure to pass into the stomach;  there was small amount of bleeding from the stricture as the endoscope advanced into the stomach, the stricture appeared benign, it was dilated with Savary dilators over a guidewire using 12, 13, 14, 15, and 16 mm dilators. There was a blood on each dilator. Biopsies were taken from the GE junction prior to the dilation to rule out Barrett's esophagus stomach normal gastric mucosa duodenum normal bulb and descending duodenum[ .retroflexed view of the fundus and cardia appeared normal        The scope was then withdrawn from the patient and the procedure completed.  COMPLICATIONS: There were no complications. ENDOSCOPIC IMPRESSION:  1.moderately severe benign appearing distal esophageal stricture 2. status post dilation to 16 mm Savary  dilator 3. status post biopsies from the GE junction to rule out Barrett's esophagus RECOMMENDATIONS: 1.  Await pathology results 2.  Anti-reflux regimen to be follow 3.  Continue PPI  REPEAT EXAM:  eSigned:  Hart Carwin, MD 08/30/2012 10:10 AM   CC:  PATIENT NAME:  Paige [name], Paige [name] MR#: [id number]

## 2012-08-30 NOTE — Patient Instructions (Addendum)
YOU HAD AN ENDOSCOPIC PROCEDURE TODAY AT THE [facility]: Refer to the procedure report that was given to you for any specific questions about what was found during the examination.  If the procedure report does not answer your questions, please call your gastroenterologist to clarify.  If you requested that your care partner not be given the details of your procedure findings, then the procedure report has been included in a sealed envelope for you to review at your convenience later.  YOU SHOULD EXPECT: Some feelings of bloating in the abdomen. Passage of more gas than usual.  Walking can help get rid of the air that was put into your GI tract during the procedure and reduce the bloating. If you had a lower endoscopy (such as a colonoscopy or flexible sigmoidoscopy) you may notice spotting of blood in your stool or on the toilet paper. If you underwent a bowel prep for your procedure, then you may not have a normal bowel movement for a few days.  DIET: Your first meal following the procedure should be a light meal and then it is ok to progress to your normal diet.  A half-sandwich or bowl of soup is an example of a good first meal.  Heavy or fried foods are harder to digest and may make you feel nauseous or bloated.  Likewise meals heavy in dairy and vegetables can cause extra gas to form and this can also increase the bloating.  Drink plenty of fluids but you should avoid alcoholic beverages for 24 hours.  ACTIVITY: Your care partner should take you home directly after the procedure.  You should plan to take it easy, moving slowly for the rest of the day.  You can resume normal activity the day after the procedure however you should NOT DRIVE or use heavy machinery for 24 hours (because of the sedation medicines used during the test).    SYMPTOMS TO REPORT IMMEDIATELY: A gastroenterologist can be reached at any hour.  During normal business hours, 8:30 AM to 5:00 PM Monday through Friday,  call [phone].  After hours and on weekends, please call the GI answering service at [phone] who will take a message and have the physician on call contact you.    Following upper endoscopy (EGD)  Vomiting of blood or coffee ground material  New chest pain or pain under the shoulder blades  Painful or persistently difficult swallowing  New shortness of breath  Fever of 100F or higher  Black, tarry-looking stools  FOLLOW UP: If any biopsies were taken you will be contacted by phone or by letter within the next 1-3 weeks.  Call your gastroenterologist if you have not heard about the biopsies in 3 weeks.  Our staff will call the home number listed on your records the next business day following your procedure to check on you and address any questions or concerns that you may have at that time regarding the information given to you following your procedure. This is a courtesy call and so if there is no answer at the home number and we have not heard from you through the emergency physician on call, we will assume that you have returned to your regular daily activities without incident.  SIGNATURES/CONFIDENTIALITY: You and/or your care partner have signed paperwork which will be entered into your electronic medical record.  These signatures attest to the fact that that the information above on your After Visit Summary has been reviewed and is understood.  Full   responsibility of the confidentiality of this discharge information lies with you and/or your care-partner.  Stricture information given.  Post dilation diet given with instructions, to follow for remainder of day.

## 2012-08-31 ENCOUNTER — Telehealth: Payer: Self-pay

## 2012-08-31 NOTE — Telephone Encounter (Signed)
  Follow up Call-  Call back number 08/30/2012  Post procedure Call Back phone  # cell [phone]  Permission to leave phone message Yes     Patient questions:  Do you have a fever, pain , or abdominal swelling? no Pain Score  0 *  Have you tolerated food without any problems? yes  Have you been able to return to your normal activities? yes  Do you have any questions about your discharge instructions: Diet   no Medications  no Follow up visit  no  Do you have questions or concerns about your Care? no  Actions: * If pain score is 4 or above: No action needed, pain <4.

## 2012-09-05 ENCOUNTER — Encounter: Payer: Self-pay | Admitting: Internal Medicine

## 2013-02-22 ENCOUNTER — Other Ambulatory Visit: Payer: Self-pay | Admitting: Internal Medicine

## 2013-04-03 ENCOUNTER — Telehealth: Payer: Self-pay | Admitting: Internal Medicine

## 2013-04-03 MED ORDER — OMEPRAZOLE 20 MG PO CPDR: 20.0000 mg | DELAYED_RELEASE_CAPSULE | Freq: Every day | ORAL | Status: DC

## 2013-04-03 NOTE — Telephone Encounter (Signed)
Advised patient that she should continue omeprazole unless advised otherwise by Dr Juanda Chance. She states that she has 1 bottle that had 120 capsules in it but no other refills. Rx has been sent to the patient's pharmacy.

## 2013-04-03 NOTE — Telephone Encounter (Signed)
Left message for patient to call back. She should remain on omeprazole unless otherwise advised by Dr Juanda Chance. It appears that we sent a 1 year supply of omeprazole in 08-2012. Did she change pharmacies?

## 2013-08-18 ENCOUNTER — Encounter: Payer: Self-pay | Admitting: Internal Medicine

## 2013-09-19 ENCOUNTER — Encounter: Payer: Self-pay | Admitting: Internal Medicine

## 2013-11-07 ENCOUNTER — Ambulatory Visit (AMBULATORY_SURGERY_CENTER): Payer: Self-pay

## 2013-11-07 VITALS — Ht 61.0 in | Wt 149.0 lb

## 2013-11-07 DIAGNOSIS — Z800 Family history of malignant neoplasm of digestive organs: Secondary | ICD-10-CM

## 2013-11-07 DIAGNOSIS — Z86010 Personal history of colonic polyps: Secondary | ICD-10-CM

## 2013-11-07 MED ORDER — MOVIPREP 100 G PO SOLR: ORAL | Status: DC

## 2013-11-07 NOTE — Progress Notes (Signed)
Per pt, no allergies to soy or egg products.Pt not taking any weight loss meds or using  O2 at home. 

## 2013-11-20 ENCOUNTER — Telehealth: Payer: Self-pay | Admitting: Internal Medicine

## 2013-11-20 NOTE — Telephone Encounter (Signed)
Pt is doing split dose Miralax dose and needed instructions clarified.  All questions and understanding voiced

## 2013-11-21 ENCOUNTER — Ambulatory Visit (AMBULATORY_SURGERY_CENTER): Payer: Medicare Other | Admitting: Internal Medicine

## 2013-11-21 ENCOUNTER — Encounter: Payer: Self-pay | Admitting: Internal Medicine

## 2013-11-21 VITALS — BP 153/74 | HR 78 | Temp 96.8°F | Resp 19 | Ht 61.0 in | Wt 149.0 lb

## 2013-11-21 DIAGNOSIS — D126 Benign neoplasm of colon, unspecified: Secondary | ICD-10-CM

## 2013-11-21 DIAGNOSIS — Z86010 Personal history of colonic polyps: Secondary | ICD-10-CM

## 2013-11-21 DIAGNOSIS — D128 Benign neoplasm of rectum: Secondary | ICD-10-CM

## 2013-11-21 DIAGNOSIS — D129 Benign neoplasm of anus and anal canal: Secondary | ICD-10-CM

## 2013-11-21 MED ORDER — SODIUM CHLORIDE 0.9 % IV SOLN: 500.0000 mL | INTRAVENOUS | Status: DC

## 2013-11-21 NOTE — Progress Notes (Signed)
Called to room to assist during endoscopic procedure.  Patient ID and intended procedure confirmed with present staff. Received instructions for my participation in the procedure from the performing physician.  

## 2013-11-21 NOTE — Progress Notes (Signed)
A/ox3 pleased with MAC, report to Kristin RN 

## 2013-11-21 NOTE — Op Note (Signed)
[facility]  [address] [place], [address]   COLONOSCOPY PROCEDURE REPORT  PATIENT: Paige [name], Paige [name]  MR#: [id number] BIRTHDATE: Jun 08, 1941 , 41  yrs. old GENDER: Female ENDOSCOPIST: Lafayette Dragon, MD REFERRED JK:[id number] Tisovec, M.D. PROCEDURE DATE:  11/21/2013 PROCEDURE:   Colonoscopy with cold biopsy polypectomy First Screening Colonoscopy - Avg.  risk and is 50 yrs.  old or older - No.  Prior Negative Screening - Now for repeat screening. N/A  History of Adenoma - Now for follow-up colonoscopy & has been > or = to 3 yrs.  Yes hx of adenoma.  Has been 3 or more years since last colonoscopy.  Polyps Removed Today? Yes. ASA CLASS:   Class II INDICATIONS:Patient's immediate family history of colon cancer and daughter with stage III colon cancer.  Adenomatous polyps removed in 2003.  Last colonoscopy in April 2008 was normal. MEDICATIONS: MAC sedation, administered by CRNA and Propofol (Diprivan) 160 mg IV  DESCRIPTION OF PROCEDURE:   After the risks benefits and alternatives of the procedure were thoroughly explained, informed consent was obtained.  A digital rectal exam revealed no abnormalities of the rectum.   The LB [id number]  endoscope was introduced through the anus and advanced to the cecum, which was identified by both the appendix and ileocecal valve. No adverse events experienced.   The quality of the prep was good, using MoviPrep  The instrument was then slowly withdrawn as the colon was fully examined.      COLON FINDINGS: A diminutive polyp was found in the rectum.  A polypectomy was performed with cold forceps.  The resection was complete and the polyp tissue was completely retrieved. Retroflexed views revealed no abnormalities. The time to cecum=4 minutes 22 seconds.  Withdrawal time=6 minutes 22 seconds.  The scope was withdrawn and the procedure completed. COMPLICATIONS: There were no complications.  ENDOSCOPIC  IMPRESSION: Diminutive polyp was found in the rectum; polypectomy was performed with cold forceps  RECOMMENDATIONS: 1.  Await pathology results 2.  high fiber diet Recall colonoscopy in 5 years   eSigned:  Lafayette Dragon, MD 11/21/2013 10:10 AM   cc:

## 2013-11-21 NOTE — Patient Instructions (Signed)
YOU HAD AN ENDOSCOPIC PROCEDURE TODAY AT THE [facility]: Refer to the procedure report that was given to you for any specific questions about what was found during the examination.  If the procedure report does not answer your questions, please call your gastroenterologist to clarify.  If you requested that your care partner not be given the details of your procedure findings, then the procedure report has been included in a sealed envelope for you to review at your convenience later.  YOU SHOULD EXPECT: Some feelings of bloating in the abdomen. Passage of more gas than usual.  Walking can help get rid of the air that was put into your GI tract during the procedure and reduce the bloating. If you had a lower endoscopy (such as a colonoscopy or flexible sigmoidoscopy) you may notice spotting of blood in your stool or on the toilet paper. If you underwent a bowel prep for your procedure, then you may not have a normal bowel movement for a few days.  DIET: Your first meal following the procedure should be a light meal and then it is ok to progress to your normal diet.  A half-sandwich or bowl of soup is an example of a good first meal.  Heavy or fried foods are harder to digest and may make you feel nauseous or bloated.  Likewise meals heavy in dairy and vegetables can cause extra gas to form and this can also increase the bloating.  Drink plenty of fluids but you should avoid alcoholic beverages for 24 hours.  ACTIVITY: Your care partner should take you home directly after the procedure.  You should plan to take it easy, moving slowly for the rest of the day.  You can resume normal activity the day after the procedure however you should NOT DRIVE or use heavy machinery for 24 hours (because of the sedation medicines used during the test).    SYMPTOMS TO REPORT IMMEDIATELY: A gastroenterologist can be reached at any hour.  During normal business hours, 8:30 AM to 5:00 PM Monday through Friday,  call [phone].  After hours and on weekends, please call the GI answering service at [phone] who will take a message and have the physician on call contact you.   Following lower endoscopy (colonoscopy or flexible sigmoidoscopy):  Excessive amounts of blood in the stool  Significant tenderness or worsening of abdominal pains  Swelling of the abdomen that is new, acute  Fever of 100F or higher  FOLLOW UP: If any biopsies were taken you will be contacted by phone or by letter within the next 1-3 weeks.  Call your gastroenterologist if you have not heard about the biopsies in 3 weeks.  Our staff will call the home number listed on your records the next business day following your procedure to check on you and address any questions or concerns that you may have at that time regarding the information given to you following your procedure. This is a courtesy call and so if there is no answer at the home number and we have not heard from you through the emergency physician on call, we will assume that you have returned to your regular daily activities without incident.  SIGNATURES/CONFIDENTIALITY: You and/or your care partner have signed paperwork which will be entered into your electronic medical record.  These signatures attest to the fact that that the information above on your After Visit Summary has been reviewed and is understood.  Full responsibility of the confidentiality of this   discharge information lies with you and/or your care-partner.  Await pathology  Please read handouts about polyps and high fiber diets  Continue your normal medications

## 2013-11-22 ENCOUNTER — Telehealth: Payer: Self-pay | Admitting: *Deleted

## 2013-11-22 NOTE — Telephone Encounter (Signed)
No answer, left message to call if questions or concerns. 

## 2013-11-29 ENCOUNTER — Encounter: Payer: Self-pay | Admitting: Internal Medicine

## 2014-04-17 ENCOUNTER — Other Ambulatory Visit: Payer: Self-pay | Admitting: Internal Medicine

## 2014-08-08 ENCOUNTER — Telehealth: Payer: Self-pay | Admitting: Internal Medicine

## 2014-08-08 NOTE — Telephone Encounter (Signed)
Spoke with patient and she has seen a study that links Protonix use to dementia. She wants to know if she should continue taking it. Please, advise.

## 2014-08-08 NOTE — Telephone Encounter (Signed)
We don't have any official recommendations as to restriting PPI use at this time. I recommend that she switche to OTC Ranitidine 150 mg daily.

## 2014-08-08 NOTE — Telephone Encounter (Signed)
Patient advised. She has also spoken to some pharmacists. She has decided she will continue the PPI for now.

## 2014-10-14 ENCOUNTER — Other Ambulatory Visit: Payer: Self-pay | Admitting: Internal Medicine

## 2015-01-09 ENCOUNTER — Other Ambulatory Visit: Payer: Self-pay | Admitting: Internal Medicine

## 2015-01-13 ENCOUNTER — Other Ambulatory Visit: Payer: Self-pay | Admitting: Internal Medicine

## 2015-04-11 ENCOUNTER — Telehealth: Payer: Self-pay

## 2015-04-11 NOTE — Telephone Encounter (Signed)
Left message to return phone call RE: Omperazole 20 mg refills from [facility]. Pt is a former Dr Olevia Perches pt and needs to see Dr Silverio Decamp or Dr Havery Moros to continue refills.

## 2015-04-23 NOTE — Telephone Encounter (Signed)
Left 2nd voicemail for pt to call and make a follow up appt with Dr Silverio Decamp or Dr Havery Moros if she wants ant refills.

## 2015-04-26 NOTE — Telephone Encounter (Signed)
Pt has not responded to phone calls. Will wait for pt to call.

## 2018-05-21 ENCOUNTER — Inpatient Hospital Stay (HOSPITAL_COMMUNITY)
Admission: EM | Admit: 2018-05-21 | Discharge: 2018-05-24 | DRG: 281 | Disposition: A | Discharge status: Home / Self Care | Payer: Medicare Other | Attending: Cardiology | Admitting: Cardiology

## 2018-05-21 ENCOUNTER — Emergency Department (HOSPITAL_COMMUNITY): Payer: Medicare Other

## 2018-05-21 ENCOUNTER — Other Ambulatory Visit: Payer: Self-pay

## 2018-05-21 ENCOUNTER — Inpatient Hospital Stay (HOSPITAL_COMMUNITY): Payer: Medicare Other

## 2018-05-21 DIAGNOSIS — I10 Essential (primary) hypertension: Secondary | ICD-10-CM | POA: Diagnosis present

## 2018-05-21 DIAGNOSIS — Z86010 Personal history of colonic polyps: Secondary | ICD-10-CM

## 2018-05-21 DIAGNOSIS — Z885 Allergy status to narcotic agent status: Secondary | ICD-10-CM

## 2018-05-21 DIAGNOSIS — K760 Fatty (change of) liver, not elsewhere classified: Secondary | ICD-10-CM | POA: Diagnosis present

## 2018-05-21 DIAGNOSIS — Z90710 Acquired absence of both cervix and uterus: Secondary | ICD-10-CM

## 2018-05-21 DIAGNOSIS — Z800 Family history of malignant neoplasm of digestive organs: Secondary | ICD-10-CM | POA: Diagnosis not present

## 2018-05-21 DIAGNOSIS — Z882 Allergy status to sulfonamides status: Secondary | ICD-10-CM | POA: Diagnosis not present

## 2018-05-21 DIAGNOSIS — I214 Non-ST elevation (NSTEMI) myocardial infarction: Secondary | ICD-10-CM | POA: Diagnosis present

## 2018-05-21 DIAGNOSIS — E119 Type 2 diabetes mellitus without complications: Secondary | ICD-10-CM | POA: Diagnosis present

## 2018-05-21 DIAGNOSIS — Z96653 Presence of artificial knee joint, bilateral: Secondary | ICD-10-CM | POA: Diagnosis present

## 2018-05-21 DIAGNOSIS — Z974 Presence of external hearing-aid: Secondary | ICD-10-CM | POA: Diagnosis not present

## 2018-05-21 DIAGNOSIS — Z7982 Long term (current) use of aspirin: Secondary | ICD-10-CM

## 2018-05-21 DIAGNOSIS — G4700 Insomnia, unspecified: Secondary | ICD-10-CM | POA: Diagnosis present

## 2018-05-21 DIAGNOSIS — F419 Anxiety disorder, unspecified: Secondary | ICD-10-CM | POA: Diagnosis present

## 2018-05-21 DIAGNOSIS — I5181 Takotsubo syndrome: Secondary | ICD-10-CM | POA: Diagnosis present

## 2018-05-21 DIAGNOSIS — I2510 Atherosclerotic heart disease of native coronary artery without angina pectoris: Secondary | ICD-10-CM | POA: Diagnosis present

## 2018-05-21 DIAGNOSIS — Z79899 Other long term (current) drug therapy: Secondary | ICD-10-CM | POA: Diagnosis not present

## 2018-05-21 DIAGNOSIS — Z888 Allergy status to other drugs, medicaments and biological substances status: Secondary | ICD-10-CM | POA: Diagnosis not present

## 2018-05-21 DIAGNOSIS — E785 Hyperlipidemia, unspecified: Secondary | ICD-10-CM | POA: Diagnosis present

## 2018-05-21 LAB — CBC WITH DIFFERENTIAL/PLATELET
Abs Immature Granulocytes: 0.04 10*3/uL (ref 0.00–0.07)
Basophils Absolute: 0 10*3/uL (ref 0.0–0.1)
Basophils Relative: 1 %
Eosinophils Absolute: 0.2 10*3/uL (ref 0.0–0.5)
Eosinophils Relative: 3 %
HCT: 44.9 % (ref 36.0–46.0)
Hemoglobin: 14.8 g/dL (ref 12.0–15.0)
Immature Granulocytes: 1 %
Lymphocytes Relative: 16 %
Lymphs Abs: 1.3 10*3/uL (ref 0.7–4.0)
MCH: 29.9 pg (ref 26.0–34.0)
MCHC: 33 g/dL (ref 30.0–36.0)
MCV: 90.7 fL (ref 80.0–100.0)
Monocytes Absolute: 0.7 10*3/uL (ref 0.1–1.0)
Monocytes Relative: 9 %
NEUTROS PCT: 70 %
NRBC: 0 % (ref 0.0–0.2)
Neutro Abs: 5.6 10*3/uL (ref 1.7–7.7)
Platelets: 284 10*3/uL (ref 150–400)
RBC: 4.95 MIL/uL (ref 3.87–5.11)
RDW: 12.2 % (ref 11.5–15.5)
WBC: 7.9 10*3/uL (ref 4.0–10.5)

## 2018-05-21 LAB — URINALYSIS, ROUTINE W REFLEX MICROSCOPIC
Bilirubin Urine: NEGATIVE
Glucose, UA: NEGATIVE mg/dL
Hgb urine dipstick: NEGATIVE
KETONES UR: 15 mg/dL — AB
Leukocytes, UA: NEGATIVE
Nitrite: NEGATIVE
Protein, ur: NEGATIVE mg/dL
Specific Gravity, Urine: 1.01 (ref 1.005–1.030)
pH: 6.5 (ref 5.0–8.0)

## 2018-05-21 LAB — COMPREHENSIVE METABOLIC PANEL
ALT: 35 U/L (ref 0–44)
ANION GAP: 13 (ref 5–15)
AST: 44 U/L — ABNORMAL HIGH (ref 15–41)
Albumin: 4.1 g/dL (ref 3.5–5.0)
Alkaline Phosphatase: 65 U/L (ref 38–126)
BUN: 19 mg/dL (ref 8–23)
CO2: 24 mmol/L (ref 22–32)
Calcium: 9.9 mg/dL (ref 8.9–10.3)
Chloride: 101 mmol/L (ref 98–111)
Creatinine, Ser: 0.7 mg/dL (ref 0.44–1.00)
GFR calc Af Amer: >60 mL/min (ref >60)
GFR calc non Af Amer: >60 mL/min (ref >60)
Glucose, Bld: 193 mg/dL — ABNORMAL HIGH (ref 70–99)
POTASSIUM: 4.3 mmol/L (ref 3.5–5.1)
Sodium: 138 mmol/L (ref 135–145)
Total Bilirubin: 0.7 mg/dL (ref 0.3–1.2)
Total Protein: 7 g/dL (ref 6.5–8.1)

## 2018-05-21 LAB — ECHOCARDIOGRAM COMPLETE
Height: 60 in
Weight: 2240 oz

## 2018-05-21 LAB — I-STAT TROPONIN, ED: TROPONIN I, POC: 1.37 ng/mL — AB (ref 0.00–0.08)

## 2018-05-21 MED ORDER — ASPIRIN EC 81 MG PO TBEC: 81.0000 mg | DELAYED_RELEASE_TABLET | Freq: Every day | ORAL | Status: DC

## 2018-05-21 MED ORDER — ONDANSETRON HCL 4 MG/2ML IJ SOLN: 4.0000 mg | Freq: Four times a day (QID) | INTRAMUSCULAR | Status: DC | PRN

## 2018-05-21 MED ORDER — ACETAMINOPHEN 325 MG PO TABS: 650.0000 mg | ORAL_TABLET | ORAL | Status: DC | PRN

## 2018-05-21 MED ORDER — HEPARIN BOLUS VIA INFUSION
3000.0000 [IU] | Freq: Once | INTRAVENOUS | Status: AC
  Administered 2018-05-21: 3000 [IU] via INTRAVENOUS
  Filled 2018-05-21: qty 3000

## 2018-05-21 MED ORDER — ASPIRIN 81 MG PO CHEW
81.0000 mg | CHEWABLE_TABLET | Freq: Every day | ORAL | Status: DC
  Administered 2018-05-22 – 2018-05-24 (×3): 81 mg via ORAL
  Filled 2018-05-21 (×3): qty 1

## 2018-05-21 MED ORDER — NITROGLYCERIN 2 % TD OINT
0.5000 [in_us] | TOPICAL_OINTMENT | Freq: Four times a day (QID) | TRANSDERMAL | Status: DC
  Administered 2018-05-21 – 2018-05-23 (×6): 0.5 [in_us] via TOPICAL
  Filled 2018-05-21 (×2): qty 1
  Filled 2018-05-21: qty 30
  Filled 2018-05-21 (×2): qty 1

## 2018-05-21 MED ORDER — HEPARIN (PORCINE) 25000 UT/250ML-% IV SOLN
850.0000 [IU]/h | INTRAVENOUS | Status: DC
  Administered 2018-05-21 – 2018-05-22 (×3): 850 [IU]/h via INTRAVENOUS
  Filled 2018-05-21 (×2): qty 250

## 2018-05-21 MED ORDER — NITROGLYCERIN 0.4 MG SL SUBL
0.4000 mg | SUBLINGUAL_TABLET | SUBLINGUAL | Status: DC | PRN
  Administered 2018-05-21: 0.4 mg via SUBLINGUAL
  Filled 2018-05-21: qty 1

## 2018-05-21 MED ORDER — INSULIN ASPART 100 UNIT/ML ~~LOC~~ SOLN
0.0000 [IU] | Freq: Three times a day (TID) | SUBCUTANEOUS | Status: DC
  Administered 2018-05-22: 3 [IU] via SUBCUTANEOUS
  Administered 2018-05-22: 5 [IU] via SUBCUTANEOUS
  Administered 2018-05-22: 3 [IU] via SUBCUTANEOUS
  Administered 2018-05-23: 5 [IU] via SUBCUTANEOUS
  Administered 2018-05-24: 2 [IU] via SUBCUTANEOUS

## 2018-05-21 MED ORDER — PANTOPRAZOLE SODIUM 40 MG PO TBEC
40.0000 mg | DELAYED_RELEASE_TABLET | Freq: Every day | ORAL | Status: DC
  Administered 2018-05-22 – 2018-05-24 (×3): 40 mg via ORAL
  Filled 2018-05-21 (×3): qty 1

## 2018-05-21 MED ORDER — METOPROLOL TARTRATE 25 MG PO TABS
25.0000 mg | ORAL_TABLET | Freq: Two times a day (BID) | ORAL | Status: DC
  Administered 2018-05-22 (×2): 25 mg via ORAL
  Filled 2018-05-21 (×2): qty 1

## 2018-05-21 MED ORDER — TICAGRELOR 90 MG PO TABS
180.0000 mg | ORAL_TABLET | Freq: Once | ORAL | Status: AC
  Administered 2018-05-21: 180 mg via ORAL
  Filled 2018-05-21: qty 2

## 2018-05-21 MED ORDER — PSYLLIUM 95 % PO PACK
1.0000 | PACK | Freq: Every day | ORAL | Status: DC
  Administered 2018-05-22 – 2018-05-23 (×2): 1 via ORAL
  Filled 2018-05-21 (×3): qty 1

## 2018-05-21 MED ORDER — DIPHENHYDRAMINE HCL 25 MG PO CAPS: 25.0000 mg | ORAL_CAPSULE | Freq: Every evening | ORAL | Status: DC | PRN

## 2018-05-21 MED ORDER — ATORVASTATIN CALCIUM 40 MG PO TABS
40.0000 mg | ORAL_TABLET | Freq: Every day | ORAL | Status: DC
  Administered 2018-05-21 – 2018-05-23 (×3): 40 mg via ORAL
  Filled 2018-05-21 (×4): qty 1

## 2018-05-21 MED ORDER — AMLODIPINE BESYLATE 5 MG PO TABS
5.0000 mg | ORAL_TABLET | Freq: Every day | ORAL | Status: DC
  Administered 2018-05-22: 5 mg via ORAL
  Filled 2018-05-21: qty 1

## 2018-05-21 MED ORDER — ALPRAZOLAM 0.5 MG PO TABS
0.5000 mg | ORAL_TABLET | Freq: Two times a day (BID) | ORAL | Status: DC | PRN
  Administered 2018-05-22 – 2018-05-23 (×3): 0.5 mg via ORAL
  Filled 2018-05-21: qty 1
  Filled 2018-05-21: qty 2
  Filled 2018-05-21: qty 1

## 2018-05-21 MED ORDER — COENZYME Q10 30 MG PO CAPS: 30.0000 mg | ORAL_CAPSULE | Freq: Every day | ORAL | Status: DC

## 2018-05-21 MED ORDER — TICAGRELOR 90 MG PO TABS
90.0000 mg | ORAL_TABLET | Freq: Two times a day (BID) | ORAL | Status: DC
  Administered 2018-05-22 – 2018-05-24 (×5): 90 mg via ORAL
  Filled 2018-05-21 (×5): qty 1

## 2018-05-21 MED ORDER — NITROGLYCERIN 0.4 MG SL SUBL: 0.4000 mg | SUBLINGUAL_TABLET | SUBLINGUAL | Status: DC | PRN

## 2018-05-21 MED ORDER — ASPIRIN 81 MG PO CHEW
324.0000 mg | CHEWABLE_TABLET | ORAL | Status: AC
  Filled 2018-05-21: qty 4

## 2018-05-21 MED ORDER — ASPIRIN 300 MG RE SUPP: 300.0000 mg | RECTAL | Status: AC

## 2018-05-21 NOTE — Progress Notes (Signed)
ANTICOAGULATION CONSULT NOTE - Initial Consult  Pharmacy Consult for Heparin Indication: chest pain/ACS  Allergies  Allergen Reactions  . Chlorhexidine     ITCHING  . Hydrocodone     SEVERE NAUSEA  . Other     MANGOS -RASH LIKE POISON IVY CASHEWS- MOUTH SWELLS POISON IVY-RASH NOTE:  PT STATES SHE CAN TAKE TRAMADOL FOR PAIN  . Oxycodone     SEVERE NAUSEA  . Sulfa Antibiotics Hives    Face bright red    Patient Measurements: Height: 5' (152.4 cm) Weight: 140 lb (63.5 kg) IBW/kg (Calculated) : 45.5  Vital Signs: Temp: 98.6 F (37 C) (12/14 1359) Temp Source: Oral (12/14 1359) BP: 153/69 (12/14 1453) Pulse Rate: 76 (12/14 1453)  Labs: Recent Labs    05/21/18 1431  HGB 14.8  HCT 44.9  PLT 284    CrCl cannot be calculated (Patient's most recent lab result is older than the maximum 21 days allowed.).   Medical History: Past Medical History:  Diagnosis Date  . Anxiety   . Arthritis   . Complication of anesthesia    PT STATES THAT SHE WAS ADVISED NOT TO HAVE SPINAL ANESTHESIA FOR HER SECOND KNEE REPLACEMENT SURGERY IN  2011-BECAUSE SHE HAD BACK PROBLEMS-PAIN AND SEVERAL EPIDURALS--STATES DR. Rolena Infante ADVISED AGAINST SPINAL .  PT STATES SHE HAD SORE THROAT FOR 3 DAYS AFTER THE GENERAL ANESTHESIA FOR  LAST KNEE REPLACEMENT  . Diverticulosis    hyperlip  . GERD (gastroesophageal reflux disease)    PAST HX--NO PROBLEMS NOW-WATCHES DIET  . 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 Hepatic steatosis 04/17/10  . Hx of adenomatous colonic polyps   . Hyperlipemia   . Hypertension   . Pain    LEFT KNEE    Assessment: 77 year old female to begin heparin for chest pain  Goal of Therapy:  Heparin level 0.3-0.7 units/ml Monitor platelets by anticoagulation protocol: Yes   Plan:  Heparin 3000 units iv bolus x 1 Heparin drip at 850 units / hr 8 hr heparin level Daily heparin level, CBC  Thank you Anette Guarneri, PharmD [phone] 05/21/2018,3:00 PM

## 2018-05-21 NOTE — ED Provider Notes (Signed)
Medical screening examination/treatment/procedure(s) were conducted as a shared visit with non-physician practitioner(s) and myself.  I personally evaluated the patient during the encounter.  Clinical Impression:   Final diagnoses:  NSTEMI (non-ST elevated myocardial infarction) [facility])    The patient is a 77 year old female, she presents with a complaint of chest pain,   she does have a history of hypertension hyperlipidemia, comes in by ambulance after having chest pain starting over the last hour.  She did not feel very well this morning when she was brushing her teeth, but started to have this pain radiating to her shoulders and to her neck.  Has never had a before, she did take aspirin prior to arrival which did not help with her symptoms.  On exam the patient appears slightly anxious, slightly pale, soft abdomen, nontender chest, clear heart and lung sounds.  EKG did not show acute ischemia  EKG performed at 1:19 PM on May 21, 2018 shows sinus rhythm, no ST elevation, no ST depression, occasional PAC and PVC.  Impression no acute ischemia.  The patient has a significantly elevated troponin at 1.3, she is having an acute myocardial infarction but not a STEMI.  Cardiology was paged, she has been ordered heparin, aspirin, nitroglycerin, she is hemodynamically stable at this time. Dr. Einar Gip consulted and will admit -    EKG Interpretation  Date/Time:  Saturday May 21 2018 13:19:02 EST Ventricular Rate:  72 PR Interval:    QRS Duration: 96 QT Interval:  412 QTC Calculation: 451 R Axis:   -11 Text Interpretation:  Sinus rhythm Multiple premature complexes, vent & supraven Low voltage, precordial leads Abnormal R-wave progression, early transition since last tracing no significant change ectopy now present Confirmed by Noemi Chapel [phone]) on 05/21/2018 2:21:42 PM       CRITICAL CARE Performed by: Johnna Acosta Total critical care time: 35 minutes Critical care time was  exclusive of separately billable procedures and treating other patients. Critical care was necessary to treat or prevent imminent or life-threatening deterioration. Critical care was time spent personally by me on the following activities: development of treatment plan with patient and/or surrogate as well as nursing, discussions with consultants, evaluation of patient's response to treatment, examination of patient, obtaining history from patient or surrogate, ordering and performing treatments and interventions, ordering and review of laboratory studies, ordering and review of radiographic studies, pulse oximetry and re-evaluation of patient's condition.    Noemi Chapel, MD 05/21/18 Vern[facility] Emerald

## 2018-05-21 NOTE — ED Notes (Signed)
Urine culture (gray tube) sent down with UA.

## 2018-05-21 NOTE — ED Notes (Signed)
ED Provider at bedside. 

## 2018-05-21 NOTE — ED Notes (Signed)
Patient aware we need urine for UA. Patient will call when ready.

## 2018-05-21 NOTE — H&P (Addendum)
Paige [name] is an 77 y.o. female.   Chief Complaint: Chest pain  HPI:   77 year old Caucasian female with hypertension, prediabetes, hyperlipidemia, GERD, anxiety, presented to [facility] emergency room earlier today after an episode of left-sided 9/10 chest pain around 11 AM while at rest after eating breakfast. Patient initially attributed her pain to good symptoms. After EMS were called, her pain approved after aspirin and sublingual nitroglycerin. Currently, pain is down to 2/10. Patient denies any shortness of breath, is comfortable.  Of note, patient reports a lot of mental stress recently, owing to her daughter's medical illness.  PCP: Dr. Domenick Gong.   Past Medical History:  Diagnosis Date  . Anxiety   . Arthritis   . Complication of anesthesia    PT STATES THAT SHE WAS ADVISED NOT TO HAVE SPINAL ANESTHESIA FOR HER SECOND KNEE REPLACEMENT SURGERY IN  2011-BECAUSE SHE HAD BACK PROBLEMS-PAIN AND SEVERAL EPIDURALS--STATES DR. Rolena Infante ADVISED AGAINST SPINAL .  PT STATES SHE HAD SORE THROAT FOR 3 DAYS AFTER THE GENERAL ANESTHESIA FOR  LAST KNEE REPLACEMENT  . Diverticulosis    hyperlip  . GERD (gastroesophageal reflux disease)    PAST HX--NO PROBLEMS NOW-WATCHES DIET  . 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 Hepatic steatosis 04/17/10  . Hx of adenomatous colonic polyps   . Hyperlipemia   . Hypertension   . Pain    LEFT KNEE    Past Surgical History:  Procedure Laterality Date  . ABDOMINAL HYSTERECTOMY    . APPENDECTOMY    . JOINT REPLACEMENT     BILATERAL KNEE REPLACEMENTS  . KNEE ARTHROSCOPY  05/25/2012   Procedure: ARTHROSCOPY KNEE;  Surgeon: Gearlean Alf, MD;  Location: [facility];  Service: Orthopedics;  Laterality: Left;  SYNOVECTOMY  . TONSILLECTOMY     AND ADNOIDECTOMY    Family History  Problem Relation Age of Onset  . Diabetes Father   . Colon cancer Daughter    Social History:  reports that she has never smoked. She has never used smokeless  tobacco. She reports that she does not drink alcohol or use drugs.  Allergies:  Allergies  Allergen Reactions  . Chlorhexidine     ITCHING  . Hydrocodone     SEVERE NAUSEA  . Other     MANGOS -RASH LIKE POISON IVY CASHEWS- MOUTH SWELLS POISON IVY-RASH NOTE:  PT STATES SHE CAN TAKE TRAMADOL FOR PAIN  . Oxycodone     SEVERE NAUSEA  . Sulfa Antibiotics Hives    Face bright red    (Not in a hospital admission)   Results for orders placed or performed during the hospital encounter of 05/21/18 (from the past 48 hour(s))  Comprehensive metabolic panel     Status: Abnormal   Collection Time: 05/21/18  2:31 PM  Result Value Ref Range   Sodium 138 135 - 145 mmol/L   Potassium 4.3 3.5 - 5.1 mmol/L   Chloride 101 98 - 111 mmol/L   CO2 24 22 - 32 mmol/L   Glucose, Bld 193 (H) 70 - 99 mg/dL   BUN 19 8 - 23 mg/dL   Creatinine, Ser 0.70 0.44 - 1.00 mg/dL   Calcium 9.9 8.9 - 10.3 mg/dL   Total Protein 7.0 6.5 - 8.1 g/dL   Albumin 4.1 3.5 - 5.0 g/dL   AST 44 (H) 15 - 41 U/L   ALT 35 0 - 44 U/L   Alkaline Phosphatase 65 38 - 126 U/L   Total  Bilirubin 0.7 0.3 - 1.2 mg/dL   GFR calc non Af Amer >60 >60 mL/min   GFR calc Af Amer >60 >60 mL/min   Anion gap 13 5 - 15    Comment: Performed at [facility] [address], [place], [place] [address]  CBC with Differential     Status: None   Collection Time: 05/21/18  2:31 PM  Result Value Ref Range   WBC 7.9 4.0 - 10.5 K/uL   RBC 4.95 3.87 - 5.11 MIL/uL   Hemoglobin 14.8 12.0 - 15.0 g/dL   HCT 44.9 36.0 - 46.0 %   MCV 90.7 80.0 - 100.0 fL   MCH 29.9 26.0 - 34.0 pg   MCHC 33.0 30.0 - 36.0 g/dL   RDW 12.2 11.5 - 15.5 %   Platelets 284 150 - 400 K/uL   nRBC 0.0 0.0 - 0.2 %   Neutrophils Relative % 70 %   Neutro Abs 5.6 1.7 - 7.7 K/uL   Lymphocytes Relative 16 %   Lymphs Abs 1.3 0.7 - 4.0 K/uL   Monocytes Relative 9 %   Monocytes Absolute 0.7 0.1 - 1.0 K/uL   Eosinophils Relative 3 %   Eosinophils Absolute 0.2 0.0 - 0.5 K/uL    Basophils Relative 1 %   Basophils Absolute 0.0 0.0 - 0.1 K/uL   Immature Granulocytes 1 %   Abs Immature Granulocytes 0.04 0.00 - 0.07 K/uL    Comment: Performed at [facility], [address] [place], [place] [address]  I-Stat Troponin, ED (not at [facility])     Status: Abnormal   Collection Time: 05/21/18  2:38 PM  Result Value Ref Range   Troponin i, poc 1.37 (HH) 0.00 - 0.08 ng/mL   Comment NOTIFIED PHYSICIAN    Comment 3            Comment: Due to the release kinetics of cTnI, a negative result within the first hours of the onset of symptoms does not rule out myocardial infarction with certainty. If myocardial infarction is still suspected, repeat the test at appropriate intervals.   Urinalysis, Routine w reflex microscopic     Status: Abnormal   Collection Time: 05/21/18  4:39 PM  Result Value Ref Range   Color, Urine YELLOW YELLOW   APPearance CLEAR CLEAR   Specific Gravity, Urine 1.010 1.005 - 1.030   pH 6.5 5.0 - 8.0   Glucose, UA NEGATIVE NEGATIVE mg/dL   Hgb urine dipstick NEGATIVE NEGATIVE   Bilirubin Urine NEGATIVE NEGATIVE   Ketones, ur 15 (A) NEGATIVE mg/dL   Protein, ur NEGATIVE NEGATIVE mg/dL   Nitrite NEGATIVE NEGATIVE   Leukocytes, UA NEGATIVE NEGATIVE    Comment: Microscopic not done on urines with negative protein, blood, leukocytes, nitrite, or glucose < 500 mg/dL. Performed at [facility], [place] [address] [place], [place] [address]    Dg Chest Portable 1 View  Result Date: 05/21/2018 CLINICAL DATA:  Central chest pain extending to anterior neck and throat since this morning at 1000 hours, history hypertension, GERD EXAM: PORTABLE CHEST 1 VIEW COMPARISON:  Portable exam 1529 hours compared to 03/12/2010 FINDINGS: Normal heart size, mediastinal contours, and pulmonary vascularity. Atherosclerotic calcification aortic arch. Lungs clear. No infiltrate, pleural effusion or pneumothorax. Spurring at RIGHT humeral head. Osseous demineralization.  IMPRESSION: No acute abnormalities. Electronically Signed   By: Lavonia Dana M.D.   On: 05/21/2018 15:51    Review of Systems  Constitutional: Negative.   HENT: Negative.  Respiratory: Negative for shortness of breath.   Cardiovascular: Positive for chest pain (2/10). Negative for leg swelling.  Gastrointestinal: Negative.   Genitourinary: Negative.   Musculoskeletal: Negative.   Skin: Negative.   Neurological: Negative for dizziness, loss of consciousness and headaches.  Endo/Heme/Allergies: Does not bruise/bleed easily.  Psychiatric/Behavioral: The patient is nervous/anxious.   All other systems reviewed and are negative.   Blood pressure 107/88, pulse 74, temperature 98.6 F (37 C), temperature source Oral, resp. rate 16, height 5' (1.524 m), weight 63.5 kg, SpO2 99 %. Physical Exam  Nursing note and vitals reviewed. Constitutional: She is oriented to person, place, and time. She appears well-developed and well-nourished. No distress.  HENT:  Head: Normocephalic and atraumatic.  Eyes: Pupils are equal, round, and reactive to light. Conjunctivae are normal.  Neck: No JVD present.  Cardiovascular: Normal rate, regular rhythm and normal heart sounds.  No murmur heard. Respiratory: Effort normal and breath sounds normal. She has no wheezes. She has no rales.  GI: Soft. Bowel sounds are normal. There is no abdominal tenderness.  Musculoskeletal:        General: No edema.  Lymphadenopathy:    She has no cervical adenopathy.  Neurological: She is alert and oriented to person, place, and time. No cranial nerve deficit.  Skin: Skin is warm and dry.  Psychiatric: She has a normal mood and affect.    Cardiac studies: EKG 05/21/2018: Sinus rhythm 72 bpm. Normal axis. Normal conduction. PAC and PVC seen. No acute ischemic changes.  Assessment/Plan 77 year old Caucasian female with hypertension, prediabetes, hyperlipidemia, GERD, anxiety, now with NSTEMI  NSTEMI: EKG with no acute  ischemic changes. Troponin 1.37.  Admit to telemetry.  Aspirin, heparin, will add Brilinta 180 mg now, 90 mg bid from tomorrow. Has had concerns about prediabetes with Crestor, will switch to Lipitor 40 mg Add metoprolol 25 mg PO bid.  Echocardiogram in am Continue amlodipine 5 mg. Hold valsartan-HCTZ 320-25 mg unless blood pressure not controlled.  Plan on doing cath on Monday, unless uncontrolled chest pain on medical therapy, electrical or hemodynamic instability.   Hypertension: As above  Hyperlipidemia: As above. Check lipid panel  Prediabetes: HbA1C  Baseline anxiety, insomnia: Continue home benadryl and xanax.   Nigel Mormon, MD 05/21/2018, 5:07 PM  Willow Reczek Esther Hardy, MD [facility] Pager: [phone] Office: [phone] If no answer Cell [phone]

## 2018-05-21 NOTE — ED Provider Notes (Addendum)
[facility] EMERGENCY DEPARTMENT Provider Note   CSN: [id number] Arrival date & time: 05/21/18  1307     History   Chief Complaint Chief Complaint  Patient presents with  . Chest Pain    HPI Paige [name] is a 77 y.o. female.  77 y.o female with a PMH of HTN, HLD presents to the ED via EMS with a chief complaint of chest pain x 1 hour ago. Patient reports feeling unwell while brushing her teeth this morning when she had a sudden constant dull middle chest pain which radiated to her shoulder. She reports the pain was 7/10. Patient thought she was having an episode of indigestion but reports the chest pain did not subsided with sitting down or with water. Patient called EMS and was advised to take ASA, this also did not help her symptoms. She reports one episode of vomiting which occurred while brushing her teeth. She denies any nausea, diaphoresis, lightheaded or dizziness. She reports not having a stress test in a while but having an EKG yearly at her PCP office.      Past Medical History:  Diagnosis Date  . Anxiety   . Arthritis   . Complication of anesthesia    PT STATES THAT SHE WAS ADVISED NOT TO HAVE SPINAL ANESTHESIA FOR HER SECOND KNEE REPLACEMENT SURGERY IN  2011-BECAUSE SHE HAD BACK PROBLEMS-PAIN AND SEVERAL EPIDURALS--STATES DR. Rolena Infante ADVISED AGAINST SPINAL .  PT STATES SHE HAD SORE THROAT FOR 3 DAYS AFTER THE GENERAL ANESTHESIA FOR  LAST KNEE REPLACEMENT  . Diverticulosis    hyperlip  . GERD (gastroesophageal reflux disease)    PAST HX--NO PROBLEMS NOW-WATCHES DIET  . 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 Hepatic steatosis 04/17/10  . Hx of adenomatous colonic polyps   . Hyperlipemia   . Hypertension   . Pain    LEFT KNEE    Patient Active Problem List   Diagnosis Date Noted  . NSTEMI (non-ST elevated myocardial infarction) ([facility]) 05/21/2018  . 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  ABDOMINAL HYSTERECTOMY    . APPENDECTOMY    . JOINT REPLACEMENT     BILATERAL KNEE REPLACEMENTS  . KNEE ARTHROSCOPY  05/25/2012   Procedure: ARTHROSCOPY KNEE;  Surgeon: Gearlean Alf, MD;  Location: [facility];  Service: Orthopedics;  Laterality: Left;  SYNOVECTOMY  . TONSILLECTOMY     AND ADNOIDECTOMY     OB History   No obstetric history on file.      Home Medications    Prior to Admission medications   Medication Sig Start Date End Date Taking? Authorizing Provider  ALPRAZolam Duanne Moron) 0.5 MG tablet Take 0.5 mg by mouth 2 (two) times daily as needed for anxiety.   Yes [provider]  amLODipine (NORVASC) 5 MG tablet Take 5 mg by mouth daily before breakfast.   Yes [provider]  aspirin EC 81 MG tablet Take 81 mg by mouth daily.   Yes [provider]  B Complex-C (B-COMPLEX WITH VITAMIN C) tablet Take 1 tablet by mouth daily.   Yes [provider]  BIOTIN PO Take 1 tablet by mouth daily.    Yes [provider]  calcium-vitamin D (OSCAL WITH D) 500-200 MG-UNIT per tablet Take 1 tablet by mouth daily.   Yes [provider]  co-enzyme Q-10 30 MG capsule Take 30 mg by mouth daily.    Yes [provider]  diphenhydrAMINE (BENADRYL) 25 MG tablet Take 25 mg by mouth. ONE EVERY NIGHT   Yes [provider]  Multiple Vitamin (MULTIVITAMIN WITH MINERALS) TABS Take 1 tablet by mouth daily.   Yes [provider]  naproxen sodium (ANAPROX) 220 MG tablet Take 220 mg by mouth at bedtime.    Yes [provider]  Omega-3 Fatty Acids (FISH OIL) 1200 MG CAPS Take 1 capsule by mouth daily.   Yes [provider]  omeprazole (PRILOSEC) 20 MG capsule TAKE ONE CAPSULE BY MOUTH EVERY DAY Patient taking differently: Take 20 mg by mouth daily.  01/14/15  Yes Lafayette Dragon, MD  psyllium (METAMUCIL) 58.6 % powder Take 1 packet by mouth. EVERY NIGHT   Yes [provider]  rosuvastatin (CRESTOR) 5 MG  tablet Take 5 mg by mouth daily.   Yes [provider]  TURMERIC PO Take 1 tablet by mouth daily.    Yes [provider]  valsartan-hydrochlorothiazide (DIOVAN-HCT) 320-25 MG per tablet Take 1 tablet by mouth daily before breakfast.   Yes [provider]  vitamin C (ASCORBIC ACID) 500 MG tablet Take 500 mg by mouth daily.   Yes [provider]  vitamin E 400 UNIT capsule Take 400 Units by mouth daily.   Yes [provider]  glucosamine-chondroitin 500-400 MG tablet Take 1 tablet by mouth daily.    [provider]    Family History Family History  Problem Relation Age of Onset  . Diabetes Father   . Colon cancer Daughter     Social History Social History   Tobacco Use  . Smoking status: Never Smoker  . Smokeless tobacco: Never Used  Substance Use Topics  . Alcohol use: No  . Drug use: No     Allergies   Chlorhexidine; Hydrocodone; Other; Oxycodone; and Sulfa antibiotics   Review of Systems Review of Systems  Constitutional: Negative for fever.  HENT: Negative for sore throat.   Eyes: Negative for redness.  Respiratory: Negative for cough and shortness of breath.   Cardiovascular: Positive for chest pain. Negative for leg swelling.  Gastrointestinal: Positive for vomiting. Negative for abdominal pain and nausea.  Genitourinary: Negative for dysuria and flank pain.  Musculoskeletal: Negative for back pain.  Skin: Negative for pallor and wound.  Neurological: Negative for syncope, light-headedness and headaches.  All other systems reviewed and are negative.    Physical Exam Updated Vital Signs BP (!) 147/67   Pulse 72   Temp 98.6 F (37 C) (Oral)   Resp 17   Ht 5' (1.524 m)   Wt 63.5 kg   SpO2 100%   BMI 27.34 kg/m   Physical Exam Vitals signs and nursing note reviewed.  Constitutional:      General: She is not in acute distress.    Appearance: She is well-developed. She is not ill-appearing,  toxic-appearing or diaphoretic.  HENT:     Head: Normocephalic and atraumatic.     Mouth/Throat:     Pharynx: No oropharyngeal exudate.  Eyes:     Pupils: Pupils are equal, round, and reactive to light.  Neck:     Musculoskeletal: Normal range of motion and neck supple.  Cardiovascular:     Rate and Rhythm: Normal rate and regular rhythm.     Heart sounds: Normal heart sounds.  Pulmonary:     Effort: Pulmonary effort is normal. No respiratory distress.     Breath sounds: Decreased breath sounds present. No wheezing, rhonchi or  rales.  Chest:     Chest wall: No tenderness.  Abdominal:     General: Bowel sounds are normal. There is no distension.     Palpations: Abdomen is soft.     Tenderness: There is no abdominal tenderness.  Musculoskeletal:        General: No tenderness or deformity.     Right lower leg: No edema.     Left lower leg: No edema.  Skin:    General: Skin is warm and dry.  Neurological:     Mental Status: She is alert and oriented to person, place, and time.      ED Treatments / Results  Labs (all labs ordered are listed, but only abnormal results are displayed) Labs Reviewed  COMPREHENSIVE METABOLIC PANEL - Abnormal; Notable for the following components:      Result Value   Glucose, Bld 193 (*)    AST 44 (*)    All other components within normal limits  URINALYSIS, ROUTINE W REFLEX MICROSCOPIC - Abnormal; Notable for the following components:   Ketones, ur 15 (*)    All other components within normal limits  I-STAT TROPONIN, ED - Abnormal; Notable for the following components:   Troponin i, poc 1.37 (*)    All other components within normal limits  CBC WITH DIFFERENTIAL/PLATELET  HEPARIN LEVEL (UNFRACTIONATED)  CBC  LIPID PANEL  HEMOGLOBIN A1C  TROPONIN I    EKG EKG Interpretation  Date/Time:  Saturday May 21 2018 13:19:02 EST Ventricular Rate:  72 PR Interval:    QRS Duration: 96 QT Interval:  412 QTC Calculation: 451 R  Axis:   -11 Text Interpretation:  Sinus rhythm Multiple premature complexes, vent & supraven Low voltage, precordial leads Abnormal R-wave progression, early transition since last tracing no significant change ectopy now present Confirmed by Noemi Chapel [id number]) on 05/21/2018 2:21:42 PM   Radiology Dg Chest Portable 1 View  Result Date: 05/21/2018 CLINICAL DATA:  Central chest pain extending to anterior neck and throat since this morning at 1000 hours, history hypertension, GERD EXAM: PORTABLE CHEST 1 VIEW COMPARISON:  Portable exam 1529 hours compared to 03/12/2010 FINDINGS: Normal heart size, mediastinal contours, and pulmonary vascularity. Atherosclerotic calcification aortic arch. Lungs clear. No infiltrate, pleural effusion or pneumothorax. Spurring at RIGHT humeral head. Osseous demineralization. IMPRESSION: No acute abnormalities. Electronically Signed   By: Lavonia Dana M.D.   On: 05/21/2018 15:51    Procedures .Critical Care Performed by: Janeece Fitting, PA-C Authorized by: Janeece Fitting, PA-C   Critical care provider statement:    Critical care time (minutes):  40   Critical care start time:  05/21/2018 3:00 PM   Critical care end time:  05/21/2018 3:41 PM   Critical care time was exclusive of:  Separately billable procedures and treating other patients   Critical care was necessary to treat or prevent imminent or life-threatening deterioration of the following conditions:  Cardiac failure   Critical care was time spent personally by me on the following activities:  Blood draw for specimens, development of treatment plan with patient or surrogate, discussions with consultants, evaluation of patient's response to treatment, examination of patient, obtaining history from patient or surrogate, ordering and performing treatments and interventions, ordering and review of laboratory studies, ordering and review of radiographic studies, pulse oximetry, re-evaluation of patient's condition and  review of old charts   (including critical care time)  Medications Ordered in ED Medications  nitroGLYCERIN (NITROSTAT) SL tablet 0.4 mg (0.4 mg Sublingual Given 05/21/18  1456)  heparin ADULT infusion 100 units/mL (25000 units/228mL sodium chloride 0.45%) (850 Units/hr Intravenous Rate/Dose Verify 05/21/18 1637)  ALPRAZolam (XANAX) tablet 0.5 mg (has no administration in time range)  amLODipine (NORVASC) tablet 5 mg (has no administration in time range)  diphenhydrAMINE (BENADRYL) capsule 25 mg (has no administration in time range)  pantoprazole (PROTONIX) EC tablet 40 mg (has no administration in time range)  psyllium (HYDROCIL/METAMUCIL) packet 1 packet (has no administration in time range)  atorvastatin (LIPITOR) tablet 40 mg (has no administration in time range)  metoprolol tartrate (LOPRESSOR) tablet 25 mg (has no administration in time range)  aspirin chewable tablet 324 mg (has no administration in time range)    Or  aspirin suppository 300 mg (has no administration in time range)  acetaminophen (TYLENOL) tablet 650 mg (has no administration in time range)  ondansetron (ZOFRAN) injection 4 mg (has no administration in time range)  nitroGLYCERIN (NITROGLYN) 2 % ointment 0.5 inch (has no administration in time range)  insulin aspart (novoLOG) injection 0-15 Units (has no administration in time range)  aspirin chewable tablet 81 mg (has no administration in time range)  ticagrelor (BRILINTA) tablet 180 mg (has no administration in time range)  ticagrelor (BRILINTA) tablet 90 mg (has no administration in time range)  heparin bolus via infusion 3,000 Units (3,000 Units Intravenous Bolus from Bag 05/21/18 1528)     Initial Impression / Assessment and Plan / ED Course  I have reviewed the triage vital signs and the nursing notes.  Pertinent labs & imaging results that were available during my care of the patient were reviewed by me and considered in my medical decision making (see  chart for details).    Presents with chest pain which began after brushing her teeth the chest pain was mainly in the middle of her chest radiating to her shoulders.  Opponent was 1.37, heparin per pharmacy was consulted.  EKG showed no ST elevations.  CBC showed no leukocytosis, hemoglobin was within normal limits.  CMP showed AST slightly elevated at 44.  Patient received aspirin by EMS along with nitro which I gave her here sublingual.  Vitals are stable, patient appears slightly uncomfortable but vitals are reassuring.  Call placed to cardiology for further recommendations.   Final Clinical Impressions(s) / ED Diagnoses   Final diagnoses:  NSTEMI (non-ST elevated myocardial infarction) [facility])    ED Discharge Orders    None       Janeece Fitting, PA-C 05/21/18 1652    Janeece Fitting, PA-C 05/21/18 1911    Noemi Chapel, MD 05/22/18 [phone]

## 2018-05-21 NOTE — ED Triage Notes (Signed)
Pt presents to ED with chest pain starting around 1.5 hours ago. Pain is dull and covers the whole chest with radiation to neck and both shoulders with mild sob. One episode of vomiting today. Pt took 324 ASA prior to EMS arrival.

## 2018-05-22 LAB — CBG MONITORING, ED
Glucose-Capillary: 166 mg/dL — ABNORMAL HIGH (ref 70–99)
Glucose-Capillary: 151 mg/dL — ABNORMAL HIGH (ref 70–99)

## 2018-05-22 LAB — CBC
HCT: 41.4 % (ref 36.0–46.0)
Hemoglobin: 13.5 g/dL (ref 12.0–15.0)
MCH: 29.6 pg (ref 26.0–34.0)
MCHC: 32.6 g/dL (ref 30.0–36.0)
MCV: 90.8 fL (ref 80.0–100.0)
PLATELETS: 272 10*3/uL (ref 150–400)
RBC: 4.56 MIL/uL (ref 3.87–5.11)
RDW: 12.5 % (ref 11.5–15.5)
WBC: 9 10*3/uL (ref 4.0–10.5)
nRBC: 0 % (ref 0.0–0.2)

## 2018-05-22 LAB — LIPID PANEL
Cholesterol: 172 mg/dL (ref 0–200)
HDL: 41 mg/dL (ref >40)
LDL CALC: 84 mg/dL (ref 0–99)
Total CHOL/HDL Ratio: 4.2 RATIO
Triglycerides: 236 mg/dL — ABNORMAL HIGH (ref <150)
VLDL: 47 mg/dL — ABNORMAL HIGH (ref 0–40)

## 2018-05-22 LAB — HEPARIN LEVEL (UNFRACTIONATED)
Heparin Unfractionated: 0.39 IU/mL (ref 0.30–0.70)
Heparin Unfractionated: 0.36 IU/mL (ref 0.30–0.70)

## 2018-05-22 LAB — HEMOGLOBIN A1C
Hgb A1c MFr Bld: 7.2 % — ABNORMAL HIGH (ref 4.8–5.6)
Mean Plasma Glucose: 159.94 mg/dL

## 2018-05-22 LAB — TROPONIN I: Troponin I: 9.17 ng/mL (ref <0.03)

## 2018-05-22 MED ORDER — ASPIRIN 81 MG PO CHEW
81.0000 mg | CHEWABLE_TABLET | ORAL | Status: AC
  Administered 2018-05-23: 81 mg via ORAL
  Filled 2018-05-22: qty 1

## 2018-05-22 MED ORDER — SODIUM CHLORIDE 0.9% FLUSH: 3.0000 mL | Freq: Two times a day (BID) | INTRAVENOUS | Status: DC

## 2018-05-22 MED ORDER — SODIUM CHLORIDE 0.9 % IV SOLN: 250.0000 mL | INTRAVENOUS | Status: DC | PRN

## 2018-05-22 MED ORDER — SODIUM CHLORIDE 0.9% FLUSH: 3.0000 mL | INTRAVENOUS | Status: DC | PRN

## 2018-05-22 MED ORDER — SODIUM CHLORIDE 0.9 % IV SOLN
INTRAVENOUS | Status: DC
  Administered 2018-05-22: 17:00:00 via INTRAVENOUS

## 2018-05-22 MED ORDER — SODIUM CHLORIDE 0.9 % IV SOLN
INTRAVENOUS | Status: DC
  Administered 2018-05-23: 06:00:00 via INTRAVENOUS

## 2018-05-22 MED ORDER — SODIUM CHLORIDE 0.9% FLUSH
3.0000 mL | Freq: Two times a day (BID) | INTRAVENOUS | Status: DC
  Administered 2018-05-22 – 2018-05-23 (×3): 3 mL via INTRAVENOUS

## 2018-05-22 MED ORDER — SODIUM CHLORIDE 0.9 % IV SOLN
250.0000 mL | INTRAVENOUS | Status: DC
  Administered 2018-05-22: 250 mL via INTRAVENOUS

## 2018-05-22 NOTE — H&P (View-Only) (Signed)
Subjective:  No chest pain with nitro paste on. No complaints.   Objective:  Vital Signs in the last 24 hours: Temp:  [98.6 F (37 C)] 98.6 F (37 C) (12/14 1359) Pulse Rate:  [66-85] 72 (12/15 0830) Resp:  [10-22] 11 (12/15 0830) BP: (103-167)/(53-90) 119/69 (12/15 0700) SpO2:  [95 %-100 %] 100 % (12/15 0830) Weight:  [63.5 kg] 63.5 kg (12/14 1315)  Intake/Output from previous day: 12/14 0701 - 12/15 0700 In: 39.6 [I.V.:39.6] Out: -  Intake/Output from this shift: No intake/output data recorded.  Physical Exam: Nursing note and vitals reviewed. Constitutional: She is oriented to person, place, and time. She appears well-developed and well-nourished. No distress.  HENT:  Head: Normocephalic and atraumatic.  Eyes: Pupils are equal, round, and reactive to light. Conjunctivae are normal.  Neck: No JVD present.  Cardiovascular: Normal rate, regular rhythm and normal heart sounds.  No murmur heard. Respiratory: Effort normal and breath sounds normal. She has no wheezes. She has no rales.  GI: Soft. Bowel sounds are normal. There is no abdominal tenderness.  Musculoskeletal:        General: No edema.  Lymphadenopathy:    She has no cervical adenopathy.  Neurological: She is alert and oriented to person, place, and time. No cranial nerve deficit.  Skin: Skin is warm and dry.  Psychiatric: She has a normal mood and affect.   Lab Results: Recent Labs    05/21/18 1431 05/22/18 0447  WBC 7.9 9.0  HGB 14.8 13.5  PLT 284 272   Recent Labs    05/21/18 1431  NA 138  K 4.3  CL 101  CO2 24  GLUCOSE 193*  BUN 19  CREATININE 0.70   Recent Labs    05/22/18 0447  TROPONINI 9.17*   Hepatic Function Panel Recent Labs    05/21/18 1431  PROT 7.0  ALBUMIN 4.1  AST 44*  ALT 35  ALKPHOS 65  BILITOT 0.7   Recent Labs    05/22/18 0447  CHOL 172   Cardiac studies: EKG 05/22/2018: Sinus rhythm 72 bpm. Normal axis. Normal conduction. Nonspecific ST-T changes  anteroseptal leads.  EKG 05/21/2018: Sinus rhythm 72 bpm. Normal axis. Normal conduction. PAC and PVC seen. No acute ischemic changes.  Echocardiogram 05/21/2018: Study Conclusions  - Left ventricle: The cavity size was normal. Wall thickness was   normal. Systolic function was mildly to moderately reduced. The   estimated ejection fraction was in the range of 40% to 45%.   Severe hypokinesis of mid to distal anteroseptal, apical,   anteroapical, inferoapical walls. Differentials include mid to   distal LAD infarct versus Takotsubo cardiomyopathy. Doppler   parameters are consistent with abnormal left ventricular   relaxation (grade 1 diastolic dysfunction). - No significant valvular abnormality. No TR jet to measure PA   pressure.   Normal right atrial pressure.  Assessment/Plan 77 year old Caucasian female with hypertension, prediabetes, hyperlipidemia, GERD, anxiety, now with NSTEMI  NSTEMI: EKG with no acute ischemic changes. Troponin 9.7.  Currently chest pain free on nitro paste. Hemodynamically, electrically stable. Echocardiogram with severe apical hypokinesis concerning for mid to distal LAD infarct. Takotsubo cardiomyopathy less likely given clinical course and high troponin.  Continue aspirin, heparin, Brilinta 90 mg bid, lipitor 40 mg, metoprolol 25 mg bid, amlodipine 5 mg. Hold valsartan-HCTZ 320-25 mg unless blood pressure not controlled.  Cath on Monday morning, unless uncontrolled chest pain on medical therapy, electrical or hemodynamic instability.  I discussed the benefits, risks of revascularization options with  the patient. I explained to her about possible long term benefit in reducing future MI with CABG. However, patient would like to avoid surgical revascularization and prefers PCI, unless anatomy is deemed not suitable to PCI.   Hypertension: As above  Hyperlipidemia: Continue lipitor 40 mg. TG >200. May need Vascepa outpatient if no improvement in  TG.  Prediabetes: HbA1C 7.2%. Will start metformin outpatient. Resume ACEi/ARB outpatient.  Baseline anxiety, insomnia: Continue home benadryl and xanax.   LOS: 1 day    Larrisha Babineau J Raegan Sipp 05/22/2018, 9:27 AM  [place], MD [facility] Pager: [phone] Office: [phone] If no answer Cell [phone]

## 2018-05-22 NOTE — Progress Notes (Signed)
ANTICOAGULATION CONSULT NOTE - Follow Up Consult  Pharmacy Consult for Heparin Indication: chest pain/ACS  Allergies  Allergen Reactions  . Chlorhexidine     ITCHING  . Hydrocodone     SEVERE NAUSEA  . Other     MANGOS -RASH LIKE POISON IVY CASHEWS- MOUTH SWELLS POISON IVY-RASH NOTE:  PT STATES SHE CAN TAKE TRAMADOL FOR PAIN  . Oxycodone     SEVERE NAUSEA  . Sulfa Antibiotics Hives    Face bright red    Patient Measurements: Height: 5' (152.4 cm) Weight: 140 lb (63.5 kg) IBW/kg (Calculated) : 45.5 Heparin Dosing Weight:    Vital Signs: BP: 119/69 (12/15 0700) Pulse Rate: 71 (12/15 0700)  Labs: Recent Labs    05/21/18 1431 05/22/18 0058 05/22/18 0447  HGB 14.8  --  13.5  HCT 44.9  --  41.4  PLT 284  --  272  HEPARINUNFRC  --  0.39 0.36  CREATININE 0.70  --   --   TROPONINI  --   --  9.17*    Estimated Creatinine Clearance: 47.4 mL/min (by C-G formula based on SCr of 0.7 mg/dL).  Assessment:  Anticoag: CP. NSTEMI. Heparin level 0.36 in goal. Hgb 14.8>13.5. Plts 272 stable.  Goal of Therapy:  Heparin level 0.3-0.7 units/ml Monitor platelets by anticoagulation protocol: Yes   Plan:  Continue IV heparin at 850 units/hr Daily heparin level, CBC Cath Monday  Josiephine Simao S. Alford Highland, PharmD, BCPS Clinical Staff Pharmacist Eilene Ghazi Stillinger 05/22/2018,7:24 AM

## 2018-05-22 NOTE — ED Notes (Signed)
Report attempted 

## 2018-05-22 NOTE — ED Provider Notes (Signed)
Pt being held in ED overnight due to lack of beds Has rising troponin of 9 EKG this morning without obvious ischemia, Pt is resting comfortably. No CP at this time.   Admitted by Cardiology - cath pending tomorrow.   Noemi Chapel, MD 05/22/18 [phone]

## 2018-05-22 NOTE — ED Notes (Signed)
Pt moved to and settled in Purple zone. Pt reconnected to cardiac monitoring, call light placed within reach. Pt noted to be resting comfortably at this time.

## 2018-05-22 NOTE — Progress Notes (Signed)
Received Paige [name] from ED.  Patient is awake, alert and oriented x 4.  Denies chest pain, sob, or pain at this time.  VSS. BBS clear through out.  Cardiac rhythm NSR with normal heart sound.  Placed on cardiac monitor, contacted CCMD.  IV infusing in left forearm, with Heparin at 8.5 cc hr and NS at 10 cc hr via pumps.  Orientation to room given.  Verbalized understanding.

## 2018-05-22 NOTE — Progress Notes (Signed)
ANTICOAGULATION CONSULT NOTE - Follow Up Consult  Pharmacy Consult for heparin Indication: NSTEMI  Labs: Recent Labs    05/21/18 1431 05/22/18 0058  HGB 14.8  --   HCT 44.9  --   PLT 284  --   HEPARINUNFRC  --  0.39  CREATININE 0.70  --     Assessment/Plan:  77yo female therapeutic on heparin with initial dosing for NSTEMI. Will continue gtt at current rate and confirm stable with additional level.   Wynona Neat, PharmD, BCPS  05/22/2018,1:45 AM

## 2018-05-22 NOTE — ED Notes (Signed)
Paige [name], Lab Tech informed this RN of Troponin 1 critical result of 9.17. [place] Cardiologist Virgina Jock, MD informed of critical lab immediately. MD verbally ordered this RN and staff to continue current treatment plan (medication and monitoring) and pt would be re-evaluated upon admission into inpt department.

## 2018-05-22 NOTE — Progress Notes (Addendum)
Subjective:  No chest pain with nitro paste on. No complaints.   Objective:  Vital Signs in the last 24 hours: Temp:  [98.6 F (37 C)] 98.6 F (37 C) (12/14 1359) Pulse Rate:  [66-85] 72 (12/15 0830) Resp:  [10-22] 11 (12/15 0830) BP: (103-167)/(53-90) 119/69 (12/15 0700) SpO2:  [95 %-100 %] 100 % (12/15 0830) Weight:  [63.5 kg] 63.5 kg (12/14 1315)  Intake/Output from previous day: 12/14 0701 - 12/15 0700 In: 39.6 [I.V.:39.6] Out: -  Intake/Output from this shift: No intake/output data recorded.  Physical Exam: Nursing note and vitals reviewed. Constitutional: She is oriented to person, place, and time. She appears well-developed and well-nourished. No distress.  HENT:  Head: Normocephalic and atraumatic.  Eyes: Pupils are equal, round, and reactive to light. Conjunctivae are normal.  Neck: No JVD present.  Cardiovascular: Normal rate, regular rhythm and normal heart sounds.  No murmur heard. Respiratory: Effort normal and breath sounds normal. She has no wheezes. She has no rales.  GI: Soft. Bowel sounds are normal. There is no abdominal tenderness.  Musculoskeletal:        General: No edema.  Lymphadenopathy:    She has no cervical adenopathy.  Neurological: She is alert and oriented to person, place, and time. No cranial nerve deficit.  Skin: Skin is warm and dry.  Psychiatric: She has a normal mood and affect.   Lab Results: Recent Labs    05/21/18 1431 05/22/18 0447  WBC 7.9 9.0  HGB 14.8 13.5  PLT 284 272   Recent Labs    05/21/18 1431  NA 138  K 4.3  CL 101  CO2 24  GLUCOSE 193*  BUN 19  CREATININE 0.70   Recent Labs    05/22/18 0447  TROPONINI 9.17*   Hepatic Function Panel Recent Labs    05/21/18 1431  PROT 7.0  ALBUMIN 4.1  AST 44*  ALT 35  ALKPHOS 65  BILITOT 0.7   Recent Labs    05/22/18 0447  CHOL 172   Cardiac studies: EKG 05/22/2018: Sinus rhythm 72 bpm. Normal axis. Normal conduction. Nonspecific ST-T changes  anteroseptal leads.  EKG 05/21/2018: Sinus rhythm 72 bpm. Normal axis. Normal conduction. PAC and PVC seen. No acute ischemic changes.  Echocardiogram 05/21/2018: Study Conclusions  - Left ventricle: The cavity size was normal. Wall thickness was   normal. Systolic function was mildly to moderately reduced. The   estimated ejection fraction was in the range of 40% to 45%.   Severe hypokinesis of mid to distal anteroseptal, apical,   anteroapical, inferoapical walls. Differentials include mid to   distal LAD infarct versus Takotsubo cardiomyopathy. Doppler   parameters are consistent with abnormal left ventricular   relaxation (grade 1 diastolic dysfunction). - No significant valvular abnormality. No TR jet to measure PA   pressure.   Normal right atrial pressure.  Assessment/Plan 77 year old Caucasian female with hypertension, prediabetes, hyperlipidemia, GERD, anxiety, now with NSTEMI  NSTEMI: EKG with no acute ischemic changes. Troponin 9.7.  Currently chest pain free on nitro paste. Hemodynamically, electrically stable. Echocardiogram with severe apical hypokinesis concerning for mid to distal LAD infarct. Takotsubo cardiomyopathy less likely given clinical course and high troponin.  Continue aspirin, heparin, Brilinta 90 mg bid, lipitor 40 mg, metoprolol 25 mg bid, amlodipine 5 mg. Hold valsartan-HCTZ 320-25 mg unless blood pressure not controlled.  Cath on Monday morning, unless uncontrolled chest pain on medical therapy, electrical or hemodynamic instability.  I discussed the benefits, risks of revascularization options with  the patient. I explained to her about possible long term benefit in reducing future MI with CABG. However, patient would like to avoid surgical revascularization and prefers PCI, unless anatomy is deemed not suitable to PCI.   Hypertension: As above  Hyperlipidemia: Continue lipitor 40 mg. TG >200. May need Vascepa outpatient if no improvement in  TG.  Prediabetes: HbA1C 7.2%. Will start metformin outpatient. Resume ACEi/ARB outpatient.  Baseline anxiety, insomnia: Continue home benadryl and xanax.   LOS: 1 day    Frankie Scipio J Leno Mathes 05/22/2018, 9:27 AM  [place], MD [facility] Pager: [phone] Office: [phone] If no answer Cell [phone]

## 2018-05-23 ENCOUNTER — Encounter (HOSPITAL_COMMUNITY): Payer: Self-pay | Admitting: Cardiology

## 2018-05-23 ENCOUNTER — Encounter (HOSPITAL_COMMUNITY)
Admission: EM | Disposition: A | Discharge status: Home / Self Care | Payer: Self-pay | Source: Home / Self Care | Attending: Cardiology

## 2018-05-23 HISTORY — PX: LEFT HEART CATH AND CORONARY ANGIOGRAPHY: CATH118249

## 2018-05-23 LAB — BASIC METABOLIC PANEL
Anion gap: 13 (ref 5–15)
BUN: 13 mg/dL (ref 8–23)
CHLORIDE: 108 mmol/L (ref 98–111)
CO2: 19 mmol/L — ABNORMAL LOW (ref 22–32)
Calcium: 8.5 mg/dL — ABNORMAL LOW (ref 8.9–10.3)
Creatinine, Ser: 0.84 mg/dL (ref 0.44–1.00)
GFR calc Af Amer: >60 mL/min (ref >60)
GFR calc non Af Amer: >60 mL/min (ref >60)
Glucose, Bld: 161 mg/dL — ABNORMAL HIGH (ref 70–99)
Potassium: 3.5 mmol/L (ref 3.5–5.1)
Sodium: 140 mmol/L (ref 135–145)

## 2018-05-23 LAB — CBC
HCT: 37.3 % (ref 36.0–46.0)
HEMOGLOBIN: 12.3 g/dL (ref 12.0–15.0)
MCH: 30.1 pg (ref 26.0–34.0)
MCHC: 33 g/dL (ref 30.0–36.0)
MCV: 91.4 fL (ref 80.0–100.0)
Platelets: 246 10*3/uL (ref 150–400)
RBC: 4.08 MIL/uL (ref 3.87–5.11)
RDW: 12.7 % (ref 11.5–15.5)
WBC: 8.2 10*3/uL (ref 4.0–10.5)
nRBC: 0 % (ref 0.0–0.2)

## 2018-05-23 LAB — GLUCOSE, CAPILLARY
GLUCOSE-CAPILLARY: 147 mg/dL — AB (ref 70–99)
Glucose-Capillary: 206 mg/dL — ABNORMAL HIGH (ref 70–99)
Glucose-Capillary: 175 mg/dL — ABNORMAL HIGH (ref 70–99)
Glucose-Capillary: 237 mg/dL — ABNORMAL HIGH (ref 70–99)
Glucose-Capillary: 147 mg/dL — ABNORMAL HIGH (ref 70–99)
Glucose-Capillary: 221 mg/dL — ABNORMAL HIGH (ref 70–99)
Glucose-Capillary: 122 mg/dL — ABNORMAL HIGH (ref 70–99)

## 2018-05-23 LAB — HEPARIN LEVEL (UNFRACTIONATED)

## 2018-05-23 SURGERY — LEFT HEART CATH AND CORONARY ANGIOGRAPHY
Anesthesia: LOCAL

## 2018-05-23 MED ORDER — ACETAMINOPHEN 325 MG PO TABS: 650.0000 mg | ORAL_TABLET | ORAL | Status: DC | PRN

## 2018-05-23 MED ORDER — HEPARIN (PORCINE) IN NACL 1000-0.9 UT/500ML-% IV SOLN
INTRAVENOUS | Status: DC | PRN
  Administered 2018-05-23 (×2): 500 mL

## 2018-05-23 MED ORDER — SODIUM CHLORIDE 0.9% FLUSH: 3.0000 mL | INTRAVENOUS | Status: DC | PRN

## 2018-05-23 MED ORDER — VALSARTAN-HYDROCHLOROTHIAZIDE 320-25 MG PO TABS: 1.0000 | ORAL_TABLET | Freq: Every day | ORAL | Status: DC

## 2018-05-23 MED ORDER — METOPROLOL SUCCINATE ER 25 MG PO TB24
25.0000 mg | ORAL_TABLET | Freq: Every day | ORAL | Status: DC
  Administered 2018-05-23 – 2018-05-24 (×2): 25 mg via ORAL
  Filled 2018-05-23 (×2): qty 1

## 2018-05-23 MED ORDER — FENTANYL CITRATE (PF) 100 MCG/2ML IJ SOLN
INTRAMUSCULAR | Status: AC
  Filled 2018-05-23: qty 2

## 2018-05-23 MED ORDER — LIDOCAINE HCL (PF) 1 % IJ SOLN
INTRAMUSCULAR | Status: DC | PRN
  Administered 2018-05-23: 2 mL

## 2018-05-23 MED ORDER — HEPARIN (PORCINE) IN NACL 1000-0.9 UT/500ML-% IV SOLN
INTRAVENOUS | Status: AC
  Filled 2018-05-23: qty 1000

## 2018-05-23 MED ORDER — IOHEXOL 350 MG/ML SOLN
INTRAVENOUS | Status: DC | PRN
  Administered 2018-05-23: 40 mL via INTRAVENOUS

## 2018-05-23 MED ORDER — SODIUM CHLORIDE 0.9% FLUSH
3.0000 mL | Freq: Two times a day (BID) | INTRAVENOUS | Status: DC
  Administered 2018-05-23 (×2): 3 mL via INTRAVENOUS

## 2018-05-23 MED ORDER — SODIUM CHLORIDE 0.9 % IV SOLN: 250.0000 mL | INTRAVENOUS | Status: DC | PRN

## 2018-05-23 MED ORDER — MIDAZOLAM HCL 2 MG/2ML IJ SOLN
INTRAMUSCULAR | Status: DC | PRN
  Administered 2018-05-23: 1 mg via INTRAVENOUS

## 2018-05-23 MED ORDER — FENTANYL CITRATE (PF) 100 MCG/2ML IJ SOLN
INTRAMUSCULAR | Status: DC | PRN
  Administered 2018-05-23: 50 ug via INTRAVENOUS

## 2018-05-23 MED ORDER — VERAPAMIL HCL 2.5 MG/ML IV SOLN
INTRAVENOUS | Status: AC
  Filled 2018-05-23: qty 2

## 2018-05-23 MED ORDER — HEPARIN SODIUM (PORCINE) 1000 UNIT/ML IJ SOLN
INTRAMUSCULAR | Status: DC | PRN
  Administered 2018-05-23: 4000 [IU] via INTRAVENOUS

## 2018-05-23 MED ORDER — ONDANSETRON HCL 4 MG/2ML IJ SOLN: 4.0000 mg | Freq: Four times a day (QID) | INTRAMUSCULAR | Status: DC | PRN

## 2018-05-23 MED ORDER — MIDAZOLAM HCL 2 MG/2ML IJ SOLN
INTRAMUSCULAR | Status: AC
  Filled 2018-05-23: qty 2

## 2018-05-23 MED ORDER — IRBESARTAN 300 MG PO TABS
300.0000 mg | ORAL_TABLET | Freq: Every day | ORAL | Status: DC
  Administered 2018-05-23 – 2018-05-24 (×2): 300 mg via ORAL
  Filled 2018-05-23 (×2): qty 1

## 2018-05-23 MED ORDER — LIDOCAINE HCL (PF) 1 % IJ SOLN
INTRAMUSCULAR | Status: AC
  Filled 2018-05-23: qty 30

## 2018-05-23 MED ORDER — HYDROCHLOROTHIAZIDE 25 MG PO TABS
25.0000 mg | ORAL_TABLET | Freq: Every day | ORAL | Status: DC
  Administered 2018-05-23 – 2018-05-24 (×2): 25 mg via ORAL
  Filled 2018-05-23 (×2): qty 1

## 2018-05-23 MED ORDER — VERAPAMIL HCL 2.5 MG/ML IV SOLN
INTRAVENOUS | Status: DC | PRN
  Administered 2018-05-23: 10 mL via INTRA_ARTERIAL

## 2018-05-23 SURGICAL SUPPLY — 9 items

## 2018-05-23 NOTE — Progress Notes (Signed)
Pt refused to get blood glucose checking. She stated she needs to treat prediabetes with diet controlled instead of medicine. Education given about nutrition choices and healthy life style.She had well understanding with teach back.   Kennyth Lose, RN

## 2018-05-23 NOTE — Interval H&P Note (Signed)
History and Physical Interval Note:  05/23/2018 7:31 AM  Paige [name]  has presented today for surgery, with the diagnosis of NSTEMI  The various methods of treatment have been discussed with the patient and family. After consideration of risks, benefits and other options for treatment, the patient has consented to  Procedure(s): LEFT HEART CATH AND CORONARY ANGIOGRAPHY (N/A) as a surgical intervention .  The patient's history has been reviewed, patient examined, no change in status, stable for surgery.  I have reviewed the patient's chart and labs.  Questions were answered to the patient's satisfaction.    2016 Appropriate Use Criteria for Coronary Revascularization in Patients With Acute Coronary Syndrome NSTEMI/UA High Risk (TIMI Score 5-7) NSTEMI/Unstable angina, stabilized patient at high risk Link Here: sistemancia.com Indication:  Revascularization by PCI or CABG of 1 or more arteries in a patient with NSTEMI or unstable angina with Stabilization after presentation High risk for clinical events  A (7) Indication: 16; Score 7     [place]

## 2018-05-23 NOTE — Progress Notes (Signed)
Subjective:  No chest pain this am.  S/p cath this am.   Objective:  Vital Signs in the last 24 hours: Temp:  [97.4 F (36.3 C)-98.5 F (36.9 C)] 97.4 F (36.3 C) (12/16 0814) Pulse Rate:  [0-130] 0 (12/16 0802) Resp:  [0-31] 0 (12/16 0802) BP: (109-154)/(56-97) 131/68 (12/16 0814) SpO2:  [0 %-100 %] 0 % (12/16 0802) Weight:  [64 kg-64.3 kg] 64.3 kg (12/16 0453)  Intake/Output from previous day: 12/15 0701 - 12/16 0700 In: 1637.8 [P.O.:250; I.V.:1387.8] Out: -  Intake/Output from this shift: No intake/output data recorded.  Physical Exam: Nursing note and vitals reviewed. Constitutional: She is oriented to person, place, and time. She appears well-developed and well-nourished. No distress.  HENT:  Head: Normocephalic and atraumatic.  Eyes: Pupils are equal, round, and reactive to light. Conjunctivae are normal.  Neck: No JVD present.  Cardiovascular: Normal rate, regular rhythm and normal heart sounds.  No murmur heard. Respiratory: Effort normal and breath sounds normal. She has no wheezes. She has no rales.  GI: Soft. Bowel sounds are normal. There is no abdominal tenderness.  Musculoskeletal:        General: No edema.  Lymphadenopathy:    She has no cervical adenopathy.  Neurological: She is alert and oriented to person, place, and time. No cranial nerve deficit.  Skin: Skin is warm and dry.  Psychiatric: She has a normal mood and affect.   Lab Results: Recent Labs    05/22/18 0447 05/23/18 0316  WBC 9.0 8.2  HGB 13.5 12.3  PLT 272 246   Recent Labs    05/21/18 1431 05/23/18 0316  NA 138 140  K 4.3 3.5  CL 101 108  CO2 24 19*  GLUCOSE 193* 161*  BUN 19 13  CREATININE 0.70 0.84   Recent Labs    05/22/18 0447  TROPONINI 9.17*   Hepatic Function Panel Recent Labs    05/21/18 1431  PROT 7.0  ALBUMIN 4.1  AST 44*  ALT 35  ALKPHOS 65  BILITOT 0.7   Recent Labs    05/22/18 0447  CHOL 172   Cardiac studies: EKG 05/22/2018: Sinus rhythm  72 bpm. Normal axis. Normal conduction. Nonspecific ST-T changes anteroseptal leads.  EKG 05/21/2018: Sinus rhythm 72 bpm. Normal axis. Normal conduction. PAC and PVC seen. No acute ischemic changes.  Echocardiogram 05/21/2018: Study Conclusions  - Left ventricle: The cavity size was normal. Wall thickness was   normal. Systolic function was mildly to moderately reduced. The   estimated ejection fraction was in the range of 40% to 45%.   Severe hypokinesis of mid to distal anteroseptal, apical,   anteroapical, inferoapical walls. Differentials include mid to   distal LAD infarct versus Takotsubo cardiomyopathy. Doppler   parameters are consistent with abnormal left ventricular   relaxation (grade 1 diastolic dysfunction). - No significant valvular abnormality. No TR jet to measure PA   pressure.   Normal right atrial pressure.  Assessment/Plan 77 year old Caucasian female with hypertension, prediabetes, hyperlipidemia, GERD, anxiety, now with NSTEMI  NSTEMI: EKG with no acute ischemic changes. Troponin 9.7.  Nonobstructive CAD on cath Recommend DAPT with aspirin Brilinta, probably for 1 year.   Stress induced cardiomyopathy: Apical ballooning out of proportion to coronary artery disease seen on cath. EF 40-45% Likely etiology of her presentation. Stop amlodipine. Switch metoprolol tartarate to metoprolol succinate 25 mg daily. Resume Valsartan-HCTZ 32-25 mg daily.  Hypertension: As above  Hyperlipidemia: Continue lipitor 40 mg. TG >200. May need Vascepa outpatient if  no improvement in TG.  Prediabetes: HbA1C 7.2%. Will start metformin outpatient.  Baseline anxiety, insomnia: Continue home benadryl and xanax.   LOS: 2 days    Manish J Patwardhan 05/23/2018, 8:26 AM  [place], MD [facility] Pager: [phone] Office: [phone] If no answer Cell [phone]

## 2018-05-24 DIAGNOSIS — E119 Type 2 diabetes mellitus without complications: Secondary | ICD-10-CM

## 2018-05-24 DIAGNOSIS — I5181 Takotsubo syndrome: Secondary | ICD-10-CM | POA: Diagnosis present

## 2018-05-24 DIAGNOSIS — I2510 Atherosclerotic heart disease of native coronary artery without angina pectoris: Secondary | ICD-10-CM

## 2018-05-24 DIAGNOSIS — E785 Hyperlipidemia, unspecified: Secondary | ICD-10-CM

## 2018-05-24 DIAGNOSIS — I10 Essential (primary) hypertension: Secondary | ICD-10-CM

## 2018-05-24 LAB — CBC
HCT: 39.3 % (ref 36.0–46.0)
HEMOGLOBIN: 12.9 g/dL (ref 12.0–15.0)
MCH: 30.1 pg (ref 26.0–34.0)
MCHC: 32.8 g/dL (ref 30.0–36.0)
MCV: 91.6 fL (ref 80.0–100.0)
Platelets: 258 10*3/uL (ref 150–400)
RBC: 4.29 MIL/uL (ref 3.87–5.11)
RDW: 12.7 % (ref 11.5–15.5)
WBC: 7.5 10*3/uL (ref 4.0–10.5)
nRBC: 0 % (ref 0.0–0.2)

## 2018-05-24 LAB — GLUCOSE, CAPILLARY: Glucose-Capillary: 146 mg/dL — ABNORMAL HIGH (ref 70–99)

## 2018-05-24 LAB — BASIC METABOLIC PANEL
Anion gap: 12 (ref 5–15)
BUN: 12 mg/dL (ref 8–23)
CO2: 22 mmol/L (ref 22–32)
Calcium: 9.2 mg/dL (ref 8.9–10.3)
Chloride: 105 mmol/L (ref 98–111)
Creatinine, Ser: 0.81 mg/dL (ref 0.44–1.00)
GFR calc Af Amer: >60 mL/min (ref >60)
GFR calc non Af Amer: >60 mL/min (ref >60)
Glucose, Bld: 141 mg/dL — ABNORMAL HIGH (ref 70–99)
POTASSIUM: 3.8 mmol/L (ref 3.5–5.1)
Sodium: 139 mmol/L (ref 135–145)

## 2018-05-24 MED ORDER — NITROGLYCERIN 0.4 MG SL SUBL: 0.4000 mg | SUBLINGUAL_TABLET | SUBLINGUAL | 3 refills | Status: AC | PRN

## 2018-05-24 MED ORDER — ATORVASTATIN CALCIUM 40 MG PO TABS: 40.0000 mg | ORAL_TABLET | Freq: Every day | ORAL | 11 refills | Status: DC

## 2018-05-24 MED ORDER — METOPROLOL SUCCINATE ER 25 MG PO TB24: 25.0000 mg | ORAL_TABLET | Freq: Every day | ORAL | 6 refills | Status: DC

## 2018-05-24 MED ORDER — TICAGRELOR 90 MG PO TABS: 90.0000 mg | ORAL_TABLET | Freq: Two times a day (BID) | ORAL | 3 refills | Status: DC

## 2018-05-24 NOTE — Discharge Summary (Signed)
Physician Discharge Summary  Patient ID: Paige [name] MRN: [id number] DOB/AGE: 08-12-1940 77 y.o.  Admit date: 05/21/2018 Discharge date: 05/24/2018  Admission Diagnoses: Chest pain  Discharge Diagnoses:  Active Problems:   NSTEMI (non-ST elevated myocardial infarction) ([place])   Stress-induced cardiomyopathy   Essential hypertension   Hyperlipidemia   Coronary artery disease   Type 2 diabetes mellitus ([place])   Discharged Condition: good  Hospital Course:   77 year old Caucasian female with hypertension, prediabetes, hyperlipidemia, GERD, anxiety, now with NSTEMI. Of note, patient reported significant stress related to medical illness in her family. Cath showed mild to moderate nonobstructive CAD, not enough to explain apical ballooning with EF 40-45% seen on echocardiogram. This is likely stress induced cardiomyopathy. Fortunately, patient is doing quite well without over heart failure and should have good prognosis.   Recommend 1 year DAPT with aspirin and Brilinta for medical treatment of NSTEMI (peak trop 9). Stopped amlodipine 5 mg and started metoprolol succinate 25 mg for management of stress cardiomyopathy. Continue Valsata-HCTZ.   Patient has been on Crestor with steady increase in HbA1C, now at 7.2%, Reasonable to switch to Lipitor 40 mg to see if the trend reverses, in case this is medication induced diabetes.  Finally, patient has significant anxiety, which could have played  Part in her stress induced cardiomyopathy. Need to consider long acting medications, such as SSRI/SNRI. Will defer this to Dr. Osborne Casco.  I will see the patient for Transition of care follow up in next 1-2 weeks.   Consults: None  Significant Diagnostic Studies:  Cath 05/23/2018: LAD: Prox-mid 30% stenosis        Small D1 with ostial 90% stenosis LCx: Large, codominant. prox focal 50% stenosis RCA: Medium caliber. Prox 30%, mid 50% stenosis (Both focal)  LVEDP 20  mmHg  Conclusion: Nonobstructive coronary artery disease  Patient's coronary anatomy and mild to moderate nonobstructive disease does not corroborate with her profound echocardiogram abnormalities showing apical ballooning. This is most likely consistent with stress induced cardiomyopathy.  Hospital echocardiogram 05/21/2018: - Left ventricle: The cavity size was normal. Wall thickness was   normal. Systolic function was mildly to moderately reduced. The   estimated ejection fraction was in the range of 40% to 45%.   Severe hypokinesis of mid to distal anteroseptal, apical,   anteroapical, inferoapical walls. Differentials include mid to   distal LAD infarct versus Takotsubo cardiomyopathy. Doppler   parameters are consistent with abnormal left ventricular   relaxation (grade 1 diastolic dysfunction). - No significant valvular abnormality. No TR jet to measure PA   pressure.   Normal right atrial pressure.  Results for Paige [name], Paige [name] (MRN [id number]) as of 05/24/2018 09:55  Ref. Range 05/24/2018 03:16  WBC Latest Ref Range: 4.0 - 10.5 K/uL 7.5  RBC Latest Ref Range: 3.87 - 5.11 MIL/uL 4.29  Hemoglobin Latest Ref Range: 12.0 - 15.0 g/dL 12.9  HCT Latest Ref Range: 36.0 - 46.0 % 39.3  MCV Latest Ref Range: 80.0 - 100.0 fL 91.6  MCH Latest Ref Range: 26.0 - 34.0 pg 30.1  MCHC Latest Ref Range: 30.0 - 36.0 g/dL 32.8  RDW Latest Ref Range: 11.5 - 15.5 % 12.7  Platelets Latest Ref Range: 150 - 400 K/uL 258  nRBC Latest Ref Range: 0.0 - 0.2 % 0.0   Results for Paige [name], Paige [name] (MRN [id number]) as of 05/24/2018 09:55  Ref. Range 05/24/2018 75:91  BASIC METABOLIC PANEL Unknown Rpt (A)  Sodium Latest Ref Range: 135 - 145 mmol/L 139  Potassium  Latest Ref Range: 3.5 - 5.1 mmol/L 3.8  Chloride Latest Ref Range: 98 - 111 mmol/L 105  CO2 Latest Ref Range: 22 - 32 mmol/L 22  Glucose Latest Ref Range: 70 - 99 mg/dL 141 (H)  BUN Latest Ref Range: 8 - 23 mg/dL 12  Creatinine Latest Ref  Range: 0.44 - 1.00 mg/dL 0.81  Calcium Latest Ref Range: 8.9 - 10.3 mg/dL 9.2  Anion gap Latest Ref Range: 5 - 15  12  GFR, Est Non African American Latest Ref Range: >60 mL/min >60  GFR, Est African American Latest Ref Range: >60 mL/min >60   Results for Paige [name], Paige [name] (MRN [id number]) as of 05/24/2018 09:55  Ref. Range 05/21/2018 14:31 05/21/2018 14:38 05/22/2018 04:47 05/23/2018 03:16 05/24/2018 03:16  Troponin I Latest Ref Range: <0.03 ng/mL   9.17 (HH)    Troponin i, poc Latest Ref Range: 0.00 - 0.08 ng/mL  1.37 (HH)     Total CHOL/HDL Ratio Latest Units: RATIO   4.2    Cholesterol Latest Ref Range: 0 - 200 mg/dL   172    HDL Cholesterol Latest Ref Range: >40 mg/dL   41    LDL (calc) Latest Ref Range: 0 - 99 mg/dL   84    Triglycerides Latest Ref Range: <150 mg/dL   236 (H)    VLDL Latest Ref Range: 0 - 40 mg/dL   47 (H)      Treatments:  Medical treatment as above  Discharge Exam: Blood pressure (!) 125/59, pulse 72, temperature 98 F (36.7 C), temperature source Oral, resp. rate 17, height 5\' 8"  (1.727 m), weight 63.9 kg, SpO2 99 %. Nursing noteand vitalsreviewed. Constitutional: She isoriented to person, place, and time. She appearswell-developedand well-nourished.No distress.  HENT:  Head:Normocephalicand atraumatic.  Eyes:Pupils are equal, round, and reactive to light.Conjunctivaeare normal.  Neck:No JVDpresent.  Cardiovascular:Normal rate,regular rhythmand normal heart sounds. No murmurheard. Respiratory:Effort normaland breath sounds normal. She hasno wheezes. She hasno rales.  AS:NKNL.Bowel sounds are normal. There isno abdominal tenderness.  Musculoskeletal:  General: No edema.  Lymphadenopathy:  She has no cervical adenopathy.  Neurological: She isalertand oriented to person, place, and time. Nocranial nerve deficit.  Skin: Skin iswarmand dry.  Psychiatric: She has anormal mood and affect.  Disposition: Discharge  disposition: 01-Home or Self Care       Discharge Instructions    Diet - low sodium heart healthy   Complete by:  As directed    Increase activity slowly   Complete by:  As directed      Allergies as of 05/24/2018      Reactions   Chlorhexidine    ITCHING   Hydrocodone    SEVERE NAUSEA   Other    MANGOS -RASH LIKE POISON IVY CASHEWS- MOUTH SWELLS POISON IVY-RASH NOTE:  PT STATES SHE CAN TAKE TRAMADOL FOR PAIN   Oxycodone    SEVERE NAUSEA   Sulfa Antibiotics Hives   Face bright red      Medication List    STOP taking these medications   amLODipine 5 MG tablet Commonly known as:  NORVASC   naproxen sodium 220 MG tablet Commonly known as:  ALEVE   rosuvastatin 5 MG tablet Commonly known as:  CRESTOR     TAKE these medications   ALPRAZolam 0.5 MG tablet Commonly known as:  XANAX Take 0.5 mg by mouth 2 (two) times daily as needed for anxiety.   aspirin EC 81 MG tablet Take 81 mg by mouth daily.   atorvastatin  40 MG tablet Commonly known as:  LIPITOR Take 1 tablet (40 mg total) by mouth daily.   B-complex with vitamin C tablet Take 1 tablet by mouth daily.   BIOTIN PO Take 1 tablet by mouth daily.   calcium-vitamin D 500-200 MG-UNIT tablet Commonly known as:  OSCAL WITH D Take 1 tablet by mouth daily.   co-enzyme Q-10 30 MG capsule Take 30 mg by mouth daily.   diphenhydrAMINE 25 MG tablet Commonly known as:  BENADRYL Take 25 mg by mouth. ONE EVERY NIGHT   Fish Oil 1200 MG Caps Take 1 capsule by mouth daily.   glucosamine-chondroitin 500-400 MG tablet Take 1 tablet by mouth daily.   metoprolol succinate 25 MG 24 hr tablet Commonly known as:  TOPROL-XL Take 1 tablet (25 mg total) by mouth daily.   multivitamin with minerals Tabs tablet Take 1 tablet by mouth daily.   nitroGLYCERIN 0.4 MG SL tablet Commonly known as:  NITROSTAT Place 1 tablet (0.4 mg total) under the tongue every 5 (five) minutes as needed for chest pain.   omeprazole  20 MG capsule Commonly known as:  PRILOSEC TAKE ONE CAPSULE BY MOUTH EVERY DAY   psyllium 58.6 % powder Commonly known as:  METAMUCIL Take 1 packet by mouth. EVERY NIGHT   ticagrelor 90 MG Tabs tablet Commonly known as:  BRILINTA Take 1 tablet (90 mg total) by mouth 2 (two) times daily.   TURMERIC PO Take 1 tablet by mouth daily.   valsartan-hydrochlorothiazide 320-25 MG tablet Commonly known as:  DIOVAN-HCT Take 1 tablet by mouth daily before breakfast.   vitamin C 500 MG tablet Commonly known as:  ASCORBIC ACID Take 500 mg by mouth daily.   vitamin E 400 UNIT capsule Take 400 Units by mouth daily.      Follow-up Information    Nigel Mormon, MD Follow up on 06/06/2018.   Specialty:  Cardiology Why:  1:00 PM Contact information: [place] [address] [phone]           Signed: Nigel Mormon 05/24/2018, 9:47 AM  Nigel Mormon, MD [facility] [place] Pager: [phone] Office: [phone] If no answer Cell [phone]

## 2018-05-25 ENCOUNTER — Encounter (HOSPITAL_COMMUNITY): Payer: Self-pay | Admitting: Cardiology

## 2018-06-28 ENCOUNTER — Other Ambulatory Visit: Payer: Self-pay | Admitting: Cardiology

## 2018-06-28 DIAGNOSIS — R0989 Other specified symptoms and signs involving the circulatory and respiratory systems: Secondary | ICD-10-CM

## 2018-07-02 ENCOUNTER — Other Ambulatory Visit: Payer: Self-pay | Admitting: Cardiology

## 2018-07-02 DIAGNOSIS — R0989 Other specified symptoms and signs involving the circulatory and respiratory systems: Secondary | ICD-10-CM

## 2018-07-15 ENCOUNTER — Other Ambulatory Visit: Payer: Self-pay

## 2018-07-18 ENCOUNTER — Other Ambulatory Visit: Payer: Self-pay

## 2018-07-18 ENCOUNTER — Ambulatory Visit: Payer: Medicare Other

## 2018-07-18 DIAGNOSIS — R0989 Other specified symptoms and signs involving the circulatory and respiratory systems: Secondary | ICD-10-CM

## 2018-07-19 ENCOUNTER — Other Ambulatory Visit: Payer: Self-pay | Admitting: Cardiology

## 2018-07-19 DIAGNOSIS — I5181 Takotsubo syndrome: Secondary | ICD-10-CM

## 2018-07-22 ENCOUNTER — Encounter: Payer: Self-pay | Admitting: Cardiology

## 2018-07-22 ENCOUNTER — Ambulatory Visit (INDEPENDENT_AMBULATORY_CARE_PROVIDER_SITE_OTHER): Payer: Medicare Other | Admitting: Cardiology

## 2018-07-22 VITALS — BP 147/63 | HR 68 | Ht 60.0 in | Wt 142.2 lb

## 2018-07-22 DIAGNOSIS — I252 Old myocardial infarction: Secondary | ICD-10-CM | POA: Diagnosis not present

## 2018-07-22 DIAGNOSIS — I2510 Atherosclerotic heart disease of native coronary artery without angina pectoris: Secondary | ICD-10-CM

## 2018-07-22 DIAGNOSIS — I10 Essential (primary) hypertension: Secondary | ICD-10-CM

## 2018-07-22 DIAGNOSIS — I5181 Takotsubo syndrome: Secondary | ICD-10-CM

## 2018-07-22 DIAGNOSIS — R0989 Other specified symptoms and signs involving the circulatory and respiratory systems: Secondary | ICD-10-CM

## 2018-07-22 MED ORDER — METOPROLOL SUCCINATE ER 50 MG PO TB24: 50.0000 mg | ORAL_TABLET | Freq: Every day | ORAL | 3 refills | Status: DC

## 2018-07-22 NOTE — Progress Notes (Addendum)
Patient is here for follow up visit.  Subjective:   @Patient  ID: Paige [name], female    DOB: 1940/08/15, 78 y.o.   MRN: [id number]  No chief complaint on file.   HPI   78 year old Caucasian female with hypertension, prediabetes, hyperlipidemia, GERD, anxiety, recent admission with chest pain. Patient was diagnosed with NSTEMI with pak trop 9 ng/mL. Echocardiogram showed apical ballooning with EF 40-45%. 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  At last visit in 05/2018, patient was doing well from stress cardiomyopathy standpoint. I continued valsartan-HCTZ 320-25 mg daily, with plans to switch to Entresto-if no improivement in EF in 3 months. I also continued metoprolol succiante with increased dose to 37.5 mg mg daily gvien suboptoimal blood pressure. Given her significant troponib elevation with her stress cardiomyopathy episode, I recommended 1 year DAPT with aspirin and Brilinta for medical treatment of NSTEMI (peak trop 9). She was started on escitalopram for her anxiety, by Dr. Osborne Casco. She was recommended carotid US due to her carotid bruit, which is still pending.   Patient is here for 2 month follow up. Since her last visit, she has been to [place] for their annual family vacation. Patient recently had URI that is now getting better with Z pack. She does not do much physical activity, but denies any chest pain, shortness of breath.  She continues to struggle with coping with stress emanating from her daughter's illness.  However, her mood has improved after starting escitalopram.  Patient was unable to take 1 tablet, thus taking half tablet daily.   Past Medical History:  Diagnosis Date  . Anxiety   . Arthritis   . Complication of anesthesia    PT STATES THAT SHE WAS ADVISED NOT TO HAVE SPINAL ANESTHESIA FOR HER SECOND KNEE REPLACEMENT SURGERY IN  2011-BECAUSE SHE HAD BACK PROBLEMS-PAIN AND SEVERAL EPIDURALS--STATES DR.  Rolena Infante ADVISED AGAINST SPINAL .  PT STATES SHE HAD SORE THROAT FOR 3 DAYS AFTER THE GENERAL ANESTHESIA FOR  LAST KNEE REPLACEMENT  . Diverticulosis    hyperlip  . GERD (gastroesophageal reflux disease)    PAST HX--NO PROBLEMS NOW-WATCHES DIET  . 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 Hepatic steatosis 04/17/10  . Hx of adenomatous colonic polyps   . Hyperlipemia   . Hypertension   . Pain    LEFT KNEE    Past Surgical History:  Procedure Laterality Date  . ABDOMINAL HYSTERECTOMY    . APPENDECTOMY    . JOINT REPLACEMENT     BILATERAL KNEE REPLACEMENTS  . KNEE ARTHROSCOPY  05/25/2012   Procedure: ARTHROSCOPY KNEE;  Surgeon: Gearlean Alf, MD;  Location: [facility];  Service: Orthopedics;  Laterality: Left;  SYNOVECTOMY  . LEFT HEART CATH AND CORONARY ANGIOGRAPHY N/A 05/23/2018   Procedure: LEFT HEART CATH AND CORONARY ANGIOGRAPHY;  Surgeon: Nigel Mormon, MD;  Location: [place] [facility];  Service: Cardiovascular;  Laterality: N/A;  . TONSILLECTOMY     AND ADNOIDECTOMY    Social History   Socioeconomic History  . Marital status: Married    Spouse name: Not on file  . Number of children: 4  . Years of education: Not on file  . Highest education level: Not on file  Occupational History  . Occupation: Agricultural engineer  Social Needs  . Financial resource strain: Not on file  . Food insecurity:    Worry: Not on file    Inability: Not on file  .  Transportation needs:    Medical: Not on file    Non-medical: Not on file  Tobacco Use  . Smoking status: Never Smoker  . Smokeless tobacco: Never Used  Substance and Sexual Activity  . Alcohol use: No  . Drug use: No  . Sexual activity: Not on file  Lifestyle  . Physical activity:    Days per week: Not on file    Minutes per session: Not on file  . Stress: Not on file  Relationships  . Social connections:    Talks on phone: Not on file    Gets together: Not on file    Attends religious service: Not on file    Active  member of club or organization: Not on file    Attends meetings of clubs or organizations: Not on file    Relationship status: Not on file  . Intimate partner violence:    Fear of current or ex partner: Not on file    Emotionally abused: Not on file    Physically abused: Not on file    Forced sexual activity: Not on file  Other Topics Concern  . Not on file  Social History Narrative  . Not on file    Current Outpatient Medications on File Prior to Visit  Medication Sig Dispense Refill  . ALPRAZolam (XANAX) 0.5 MG tablet Take 0.5 mg by mouth 2 (two) times daily as needed for anxiety.    Marland Kitchen aspirin EC 81 MG tablet Take 81 mg by mouth daily.    Marland Kitchen atorvastatin (LIPITOR) 40 MG tablet Take 1 tablet (40 mg total) by mouth daily. 30 tablet 11  . B Complex-C (B-COMPLEX WITH VITAMIN C) tablet Take 1 tablet by mouth daily.    Marland Kitchen BIOTIN PO Take 1 tablet by mouth daily.     . calcium-vitamin D (OSCAL WITH D) 500-200 MG-UNIT per tablet Take 1 tablet by mouth daily.    Marland Kitchen co-enzyme Q-10 30 MG capsule Take 30 mg by mouth daily.     . diphenhydrAMINE (BENADRYL) 25 MG tablet Take 25 mg by mouth. ONE EVERY NIGHT    . glucosamine-chondroitin 500-400 MG tablet Take 1 tablet by mouth daily.    . metoprolol succinate (TOPROL-XL) 25 MG 24 hr tablet Take 1 tablet (25 mg total) by mouth daily. 30 tablet 6  . Multiple Vitamin (MULTIVITAMIN WITH MINERALS) TABS Take 1 tablet by mouth daily.    . nitroGLYCERIN (NITROSTAT) 0.4 MG SL tablet Place 1 tablet (0.4 mg total) under the tongue every 5 (five) minutes as needed for chest pain. 30 tablet 3  . Omega-3 Fatty Acids (FISH OIL) 1200 MG CAPS Take 1 capsule by mouth daily.    Marland Kitchen omeprazole (PRILOSEC) 20 MG capsule TAKE ONE CAPSULE BY MOUTH EVERY DAY (Patient taking differently: Take 20 mg by mouth daily. ) 90 capsule 0  . psyllium (METAMUCIL) 58.6 % powder Take 1 packet by mouth. EVERY NIGHT    . ticagrelor (BRILINTA) 90 MG TABS tablet Take 1 tablet (90 mg total) by  mouth 2 (two) times daily. 60 tablet 3  . TURMERIC PO Take 1 tablet by mouth daily.     . valsartan-hydrochlorothiazide (DIOVAN-HCT) 320-25 MG per tablet Take 1 tablet by mouth daily before breakfast.    . vitamin C (ASCORBIC ACID) 500 MG tablet Take 500 mg by mouth daily.    . vitamin E 400 UNIT capsule Take 400 Units by mouth daily.     No current facility-administered medications on file prior  to visit.     Cardiovascular studies:  Cath 05/23/2018: LM: Normal LAD: Prox-mid 30% stenosis Small D1 with ostial 90% stenosis LCx: Large, codominant. prox focal 50% stenosis RCA: Medium caliber. Prox 30%, mid 50% stenosis (Both focal) LVEDP 20 mmHg Conclusion: Nonobstructive coronary artery disease Patient's coronary anatomy and mild to moderate nonobstructive disease does not corroborate with her profound echocardiogram abnormalities showing apical ballooning. This is most likely consistent with stress induced cardiomyopathy.  Hospital echocardiogram 05/21/2018: - Left ventricle: The cavity size was normal. Wall thickness was normal. Systolic function was mildly to moderately reduced. The estimated ejection fraction was in the range of 40% to 45%. Severe hypokinesis of mid to distal anteroseptal, apical, anteroapical, inferoapical walls. Differentials include mid to distal LAD infarct versus Takotsubo cardiomyopathy. Doppler parameters are consistent with abnormal left ventricular relaxation (grade 1 diastolic dysfunction). - No significant valvular abnormality. No TR jet to measure PA pressure. Normal right atrial pressure.  Hospital EKG 05/23/2018: Sinus rhyth, 67 bpm. Normal axis. Normal conduction. Possible inferior infarct. Anterolateral T wave inversion, possible ischemia.  Review of Systems  Constitution: Negative for decreased appetite, malaise/fatigue, weight gain and weight loss.  HENT: Negative for congestion.   Eyes: Negative for visual disturbance.    Cardiovascular: Negative for chest pain, dyspnea on exertion, leg swelling, palpitations and syncope.  Respiratory: Negative for shortness of breath.   Endocrine: Negative for cold intolerance.  Hematologic/Lymphatic: Does not bruise/bleed easily.  Skin: Negative for itching and rash.  Musculoskeletal: Negative for myalgias.  Gastrointestinal: Negative for abdominal pain, nausea and vomiting.  Genitourinary: Negative for dysuria.  Neurological: Negative for dizziness and weakness.  Psychiatric/Behavioral: Positive for depression. The patient is nervous/anxious.   All other systems reviewed and are negative.      Objective:   There were no vitals filed for this visit.   Physical Exam  Constitutional: She is oriented to person, place, and time. She appears well-developed and well-nourished. No distress.  HENT:  Head: Normocephalic and atraumatic.  Eyes: Pupils are equal, round, and reactive to light. Conjunctivae are normal.  Neck: No JVD present.  Cardiovascular: Normal rate, regular rhythm and intact distal pulses.  Pulses:      Carotid pulses are on the left side with bruit. Pulmonary/Chest: Effort normal and breath sounds normal. She has no wheezes. She has no rales.  Abdominal: Soft. Bowel sounds are normal. There is no rebound.  Musculoskeletal:        General: No edema.  Lymphadenopathy:    She has no cervical adenopathy.  Neurological: She is alert and oriented to person, place, and time. No cranial nerve deficit.  Skin: Skin is warm and dry.  Psychiatric: She has a normal mood and affect.  Nursing note and vitals reviewed.       Assessment & Recommendations:   78 year old Caucasian female with hypertension, prediabetes, hyperlipidemia, GERD, anxiety,NSTEMI and stress induced cardiomyopathy.  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  No obstructive CAD to explain these findings.  Presentation likely due to stress-induced cardiomyopathy.   Given her overall stable hemodynamic status and NYHA class II-III symptoms. I am hopeful that she will have recovery and good prognosis. Continue valsartan-HCTZ 320-25 mg daily. If no improivement in EF in 3 months-upcoming echocardiogram in 08/2018, will switch to [facility]. Continue metoprolol succiante, increase dose to 37.5 mg mg daily gviemn siboptimal blood pressure.  H/o NSTEMI: GIven moderate CAD, recommend 1 year DAPT with aspirin and Brilinta for medical management.  Left carotid bruit:  Result pending. Will inform the patient if significant abnormalities found.  Hypertension: Suboptimal. Increased metoprolol to 50 mg daily.  Type 2 DM: There is concern if her pre-diabetes progressed to diabetes while on Crestor. Crestor is now switched to lipitor 40 mg daily. Recommend follow up with PCP. If A1C stays above 7, she will need treatment for diabetes.  Anxiety, possible depression: Started on escitalopram. Unable to tolerate 10 mg daily to loss of appetitre. Currently using 1/2 tab of 10 mg daily.   Above note has been written by me. Izell , Medical student was involved in chart review for this patient.   Nigel Mormon, MD [facility] [place] Pager: [phone] Office: [phone] If no answer Cell [phone]

## 2018-07-23 ENCOUNTER — Encounter: Payer: Self-pay | Admitting: Cardiology

## 2018-07-25 ENCOUNTER — Telehealth: Payer: Self-pay | Admitting: Cardiology

## 2018-07-25 NOTE — Telephone Encounter (Signed)
Spoke with pt advised her of Carotid test results

## 2018-07-25 NOTE — Telephone Encounter (Signed)
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 Antegrade right vertebral artery flow. Antegrade left vertebral artery flow. Follow up  is appropriate when clinically indicated.

## 2018-08-08 ENCOUNTER — Ambulatory Visit: Payer: Medicare Other

## 2018-08-08 DIAGNOSIS — I5181 Takotsubo syndrome: Secondary | ICD-10-CM | POA: Diagnosis not present

## 2018-08-14 NOTE — Progress Notes (Deleted)
*** Prior note copied *** Patient is here for follow up visit.  Subjective:   @Patient  ID: Paige [name], female    DOB: January 27, 1941, 78 y.o.   MRN: [id number]  No chief complaint on file.   HPI   78 year old Caucasian female with hypertension, prediabetes, hyperlipidemia, GERD, anxiety, recent admission with chest pain. Patient was diagnosed with NSTEMI with pak trop 9 ng/mL. Echocardiogram showed apical ballooning with EF 40-45%. 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  At last visit in 05/2018, patient was doing well from stress cardiomyopathy standpoint. I continued valsartan-HCTZ 320-25 mg daily, with plans to switch to Entresto-if no improivement in EF in 3 months. I also continued metoprolol succiante with increased dose to 37.5 mg mg daily gvien suboptoimal blood pressure. Given her significant troponib elevation with her stress cardiomyopathy episode, I recommended 1 year DAPT with aspirin and Brilinta for medical treatment of NSTEMI (peak trop 9). She was started on escitalopram for her anxiety, by Dr. Osborne Casco. She was recommended carotid US due to her carotid bruit, which is still pending.   Patient is here for 2 month follow up. Since her last visit, she has been to [place] for their annual family vacation. Patient recently had URI that is now getting better with Z pack. She does not do much physical activity, but denies any chest pain, shortness of breath.  She continues to struggle with coping with stress emanating from her daughter's illness.  However, her mood has improved after starting escitalopram.  Patient was unable to take 1 tablet, thus taking half tablet daily.   Past Medical History:  Diagnosis Date  . Anxiety   . Arthritis   . Complication of anesthesia    PT STATES THAT SHE WAS ADVISED NOT TO HAVE SPINAL ANESTHESIA FOR HER SECOND KNEE REPLACEMENT SURGERY IN  2011-BECAUSE SHE HAD BACK PROBLEMS-PAIN AND  SEVERAL EPIDURALS--STATES DR. Rolena Infante ADVISED AGAINST SPINAL .  PT STATES SHE HAD SORE THROAT FOR 3 DAYS AFTER THE GENERAL ANESTHESIA FOR  LAST KNEE REPLACEMENT  . Diverticulosis    hyperlip  . GERD (gastroesophageal reflux disease)    PAST HX--NO PROBLEMS NOW-WATCHES DIET  . 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 Hepatic steatosis 04/17/10  . Hx of adenomatous colonic polyps   . Hyperlipemia   . Hypertension   . Pain    LEFT KNEE    Past Surgical History:  Procedure Laterality Date  . ABDOMINAL HYSTERECTOMY    . APPENDECTOMY    . JOINT REPLACEMENT     BILATERAL KNEE REPLACEMENTS  . KNEE ARTHROSCOPY  05/25/2012   Procedure: ARTHROSCOPY KNEE;  Surgeon: Gearlean Alf, MD;  Location: [facility];  Service: Orthopedics;  Laterality: Left;  SYNOVECTOMY  . LEFT HEART CATH AND CORONARY ANGIOGRAPHY N/A 05/23/2018   Procedure: LEFT HEART CATH AND CORONARY ANGIOGRAPHY;  Surgeon: Nigel Mormon, MD;  Location: [place] [facility];  Service: Cardiovascular;  Laterality: N/A;  . TONSILLECTOMY     AND ADNOIDECTOMY    Social History   Socioeconomic History  . Marital status: Married    Spouse name: Not on file  . Number of children: 4  . Years of education: Not on file  . Highest education level: Not on file  Occupational History  . Occupation: Agricultural engineer  Social Needs  . Financial resource strain: Not on file  . Food insecurity:    Worry: Not on file  Inability: Not on file  . Transportation needs:    Medical: Not on file    Non-medical: Not on file  Tobacco Use  . Smoking status: Never Smoker  . Smokeless tobacco: Never Used  Substance and Sexual Activity  . Alcohol use: No  . Drug use: No  . Sexual activity: Not on file  Lifestyle  . Physical activity:    Days per week: Not on file    Minutes per session: Not on file  . Stress: Not on file  Relationships  . Social connections:    Talks on phone: Not on file    Gets together: Not on file    Attends religious  service: Not on file    Active member of club or organization: Not on file    Attends meetings of clubs or organizations: Not on file    Relationship status: Not on file  . Intimate partner violence:    Fear of current or ex partner: Not on file    Emotionally abused: Not on file    Physically abused: Not on file    Forced sexual activity: Not on file  Other Topics Concern  . Not on file  Social History Narrative  . Not on file    Current Outpatient Medications on File Prior to Visit  Medication Sig Dispense Refill  . ALPRAZolam (XANAX) 0.5 MG tablet Take 0.5 mg by mouth 2 (two) times daily as needed for anxiety.    Marland Kitchen aspirin EC 81 MG tablet Take 81 mg by mouth daily.    Marland Kitchen atorvastatin (LIPITOR) 40 MG tablet Take 1 tablet (40 mg total) by mouth daily. 30 tablet 11  . B Complex-C (B-COMPLEX WITH VITAMIN C) tablet Take 1 tablet by mouth daily.    Marland Kitchen BIOTIN PO Take 1 tablet by mouth daily.     . calcium-vitamin D (OSCAL WITH D) 500-200 MG-UNIT per tablet Take 1 tablet by mouth daily.    Marland Kitchen co-enzyme Q-10 30 MG capsule Take 30 mg by mouth daily.     . diphenhydrAMINE (BENADRYL) 25 MG tablet Take 25 mg by mouth. ONE EVERY NIGHT    . escitalopram (LEXAPRO) 10 MG tablet Take 5 mg by mouth daily.     Marland Kitchen glucosamine-chondroitin 500-400 MG tablet Take 1 tablet by mouth daily.    . metoprolol succinate (TOPROL-XL) 50 MG 24 hr tablet Take 1 tablet (50 mg total) by mouth daily. 90 tablet 3  . Multiple Vitamin (MULTIVITAMIN WITH MINERALS) TABS Take 1 tablet by mouth daily.    . nitroGLYCERIN (NITROSTAT) 0.4 MG SL tablet Place 1 tablet (0.4 mg total) under the tongue every 5 (five) minutes as needed for chest pain. 30 tablet 3  . Omega-3 Fatty Acids (FISH OIL) 1200 MG CAPS Take 1 capsule by mouth daily.    Marland Kitchen omeprazole (PRILOSEC) 20 MG capsule TAKE ONE CAPSULE BY MOUTH EVERY DAY (Patient taking differently: Take 20 mg by mouth daily. ) 90 capsule 0  . psyllium (METAMUCIL) 58.6 % powder Take 1 packet by  mouth. EVERY NIGHT    . ticagrelor (BRILINTA) 90 MG TABS tablet Take 1 tablet (90 mg total) by mouth 2 (two) times daily. 60 tablet 3  . TURMERIC PO Take 1 tablet by mouth daily.     . valsartan-hydrochlorothiazide (DIOVAN-HCT) 320-25 MG per tablet Take 1 tablet by mouth daily before breakfast.    . vitamin C (ASCORBIC ACID) 500 MG tablet Take 500 mg by mouth daily.    Marland Kitchen  vitamin E 400 UNIT capsule Take 400 Units by mouth daily.     No current facility-administered medications on file prior to visit.     Cardiovascular studies:  Echocardiogram 08/08/2018: Left ventricle cavity is normal in size. Mild concentric hypertrophy of the left ventricle. Normal global wall motion. Indeterminate diastolic filling pattern. Calculated EF 63%. Mild (Grade I) mitral regurgitation. Inadequate TR jet to estimate pulmonary artery systolic pressure. Normal right atrial pressure. Compared to previous study on 05/21/2018, LVEF has significantly improved. Apical ballooning has resolved.   Cath 05/23/2018: LM: Normal LAD: Prox-mid 30% stenosis Small D1 with ostial 90% stenosis LCx: Large, codominant. prox focal 50% stenosis RCA: Medium caliber. Prox 30%, mid 50% stenosis (Both focal) LVEDP 20 mmHg Conclusion: Nonobstructive coronary artery disease Patient's coronary anatomy and mild to moderate nonobstructive disease does not corroborate with her profound echocardiogram abnormalities showing apical ballooning. This is most likely consistent with stress induced cardiomyopathy.  Hospital echocardiogram 05/21/2018: - Left ventricle: The cavity size was normal. Wall thickness was normal. Systolic function was mildly to moderately reduced. The estimated ejection fraction was in the range of 40% to 45%. Severe hypokinesis of mid to distal anteroseptal, apical, anteroapical, inferoapical walls. Differentials include mid to distal LAD infarct versus Takotsubo cardiomyopathy. Doppler parameters are  consistent with abnormal left ventricular relaxation (grade 1 diastolic dysfunction). - No significant valvular abnormality. No TR jet to measure PA pressure. Normal right atrial pressure.  Hospital EKG 05/23/2018: Sinus rhyth, 67 bpm. Normal axis. Normal conduction. Possible inferior infarct. Anterolateral T wave inversion, possible ischemia.  Review of Systems  Constitution: Negative for decreased appetite, malaise/fatigue, weight gain and weight loss.  HENT: Negative for congestion.   Eyes: Negative for visual disturbance.  Cardiovascular: Negative for chest pain, dyspnea on exertion, leg swelling, palpitations and syncope.  Respiratory: Negative for shortness of breath.   Endocrine: Negative for cold intolerance.  Hematologic/Lymphatic: Does not bruise/bleed easily.  Skin: Negative for itching and rash.  Musculoskeletal: Negative for myalgias.  Gastrointestinal: Negative for abdominal pain, nausea and vomiting.  Genitourinary: Negative for dysuria.  Neurological: Negative for dizziness and weakness.  Psychiatric/Behavioral: Positive for depression. The patient is nervous/anxious.   All other systems reviewed and are negative.      Objective:   There were no vitals filed for this visit.   Physical Exam  Constitutional: She is oriented to person, place, and time. She appears well-developed and well-nourished. No distress.  HENT:  Head: Normocephalic and atraumatic.  Eyes: Pupils are equal, round, and reactive to light. Conjunctivae are normal.  Neck: No JVD present.  Cardiovascular: Normal rate, regular rhythm and intact distal pulses.  Pulses:      Carotid pulses are on the left side with bruit. Pulmonary/Chest: Effort normal and breath sounds normal. She has no wheezes. She has no rales.  Abdominal: Soft. Bowel sounds are normal. There is no rebound.  Musculoskeletal:        General: No edema.  Lymphadenopathy:    She has no cervical adenopathy.  Neurological: She  is alert and oriented to person, place, and time. No cranial nerve deficit.  Skin: Skin is warm and dry.  Psychiatric: She has a normal mood and affect.  Nursing note and vitals reviewed.       Assessment & Recommendations:   78 year old Caucasian female with hypertension, prediabetes, hyperlipidemia, GERD, anxiety,NSTEMI and stress induced cardiomyopathy.  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  No obstructive CAD to explain these findings.  Presentation likely  due to stress-induced cardiomyopathy.  Given her overall stable hemodynamic status and NYHA class II-III symptoms. I am hopeful that she will have recovery and good prognosis. Continue valsartan-HCTZ 320-25 mg daily. If no improivement in EF in 3 months-upcoming echocardiogram in 08/2018, will switch to [facility]. Continue metoprolol succiante, increase dose to 37.5 mg mg daily gviemn siboptimal blood pressure.  H/o NSTEMI: GIven moderate CAD, recommend 1 year DAPT with aspirin and Brilinta for medical management.  Left carotid bruit: Result pending. Will inform the patient if significant abnormalities found.  Hypertension: Suboptimal. Increased metoprolol to 50 mg daily.  Type 2 DM: There is concern if her pre-diabetes progressed to diabetes while on Crestor. Crestor is now switched to lipitor 40 mg daily. Recommend follow up with PCP. If A1C stays above 7, she will need treatment for diabetes.  Anxiety, possible depression: Started on escitalopram. Unable to tolerate 10 mg daily to loss of appetitre. Currently using 1/2 tab of 10 mg daily.   Above note has been written by me. Izell [place], Medical student was involved in chart review for this patient.   Nigel Mormon, MD [facility] [place] Pager: [phone] Office: [phone] If no answer Cell [phone]

## 2018-08-19 ENCOUNTER — Ambulatory Visit: Payer: Medicare Other | Admitting: Cardiology

## 2018-08-19 ENCOUNTER — Telehealth: Payer: Self-pay | Admitting: Cardiology

## 2018-08-19 NOTE — Telephone Encounter (Signed)
78 year old Caucasian female with hypertension, prediabetes, hyperlipidemia, GERD, anxiety,NSTEMI and stress induced cardiomyopathy.  Echocardiogram 05/21/2018: - Left ventricle: The cavity size was normal. Wall thickness was   normal. Systolic function was mildly to moderately reduced. The   estimated ejection fraction was in the range of 40% to 45%.   Severe hypokinesis of mid to distal anteroseptal, apical,   anteroapical, inferoapical walls. Differentials include mid to   distal LAD infarct versus Takotsubo cardiomyopathy. Doppler   parameters are consistent with abnormal left ventricular   relaxation (grade 1 diastolic dysfunction). - No significant valvular abnormality. No TR jet to measure PA   pressure.   Normal right atrial pressure.  I conveyed the above results to her over the phone. EF and wall motion abnormalities have now normalized, suggesting that her episode in 05/2018 was indeed stress induced cardiomyopathy.  I will see her back in 3 months.

## 2018-08-22 ENCOUNTER — Encounter: Payer: Self-pay | Admitting: Cardiology

## 2018-08-22 DIAGNOSIS — Z0189 Encounter for other specified special examinations: Secondary | ICD-10-CM | POA: Insufficient documentation

## 2018-09-12 ENCOUNTER — Other Ambulatory Visit: Payer: Self-pay

## 2018-09-12 DIAGNOSIS — I10 Essential (primary) hypertension: Secondary | ICD-10-CM

## 2018-09-12 MED ORDER — TICAGRELOR 90 MG PO TABS: 90.0000 mg | ORAL_TABLET | Freq: Two times a day (BID) | ORAL | 3 refills | Status: DC

## 2018-11-21 ENCOUNTER — Ambulatory Visit: Payer: Medicare Other | Admitting: Cardiology

## 2018-11-23 ENCOUNTER — Encounter: Payer: Self-pay | Admitting: Gastroenterology

## 2018-12-06 ENCOUNTER — Other Ambulatory Visit: Payer: Self-pay | Admitting: Cardiology

## 2018-12-06 DIAGNOSIS — I10 Essential (primary) hypertension: Secondary | ICD-10-CM

## 2018-12-06 NOTE — Telephone Encounter (Signed)
Please fill

## 2018-12-20 ENCOUNTER — Other Ambulatory Visit: Payer: Self-pay | Admitting: Cardiology

## 2018-12-20 DIAGNOSIS — I10 Essential (primary) hypertension: Secondary | ICD-10-CM

## 2018-12-23 ENCOUNTER — Other Ambulatory Visit: Payer: Self-pay

## 2018-12-23 DIAGNOSIS — I10 Essential (primary) hypertension: Secondary | ICD-10-CM

## 2018-12-23 MED ORDER — TICAGRELOR 90 MG PO TABS: 90.0000 mg | ORAL_TABLET | Freq: Two times a day (BID) | ORAL | 3 refills | Status: DC

## 2019-01-13 ENCOUNTER — Encounter: Payer: Self-pay | Admitting: Cardiology

## 2019-01-13 ENCOUNTER — Ambulatory Visit: Payer: Medicare Other | Admitting: Cardiology

## 2019-01-13 ENCOUNTER — Other Ambulatory Visit: Payer: Self-pay

## 2019-01-13 VITALS — BP 176/77 | HR 63 | Ht 60.0 in | Wt 140.7 lb

## 2019-01-13 DIAGNOSIS — I10 Essential (primary) hypertension: Secondary | ICD-10-CM

## 2019-01-13 DIAGNOSIS — I2510 Atherosclerotic heart disease of native coronary artery without angina pectoris: Secondary | ICD-10-CM | POA: Diagnosis not present

## 2019-01-13 MED ORDER — METOPROLOL SUCCINATE ER 100 MG PO TB24: 100.0000 mg | ORAL_TABLET | Freq: Every day | ORAL | 2 refills | Status: DC

## 2019-01-13 NOTE — Progress Notes (Signed)
Follow up visit  Subjective:   Paige [name], female    DOB: 10-07-1940, 78 y.o.   MRN: [id number]   Chief Complaint  Patient presents with  . Coronary Artery Disease  . Hypertension  . Follow-up    HPI  78 year old Caucasian female with hypertension, prediabetes, hyperlipidemia, GERD, anxiety,NSTEMI and stress induced cardiomyopathy (04/2018).  In March 2020, echocardiogram showed normalization of LV wall motion and LVEF. She denies chest pain, shortness of breath, palpitations, leg edema, orthopnea, PND, TIA/syncope. Her blood pressure has been elevated. She endorses a lot of stress related to her daughter's health.   Past Medical History:  Diagnosis Date  . Anxiety   . Arthritis   . Complication of anesthesia    PT STATES THAT SHE WAS ADVISED NOT TO HAVE SPINAL ANESTHESIA FOR HER SECOND KNEE REPLACEMENT SURGERY IN  2011-BECAUSE SHE HAD BACK PROBLEMS-PAIN AND SEVERAL EPIDURALS--STATES DR. Rolena Infante ADVISED AGAINST SPINAL .  PT STATES SHE HAD SORE THROAT FOR 3 DAYS AFTER THE GENERAL ANESTHESIA FOR  LAST KNEE REPLACEMENT  . Diverticulosis    hyperlip  . GERD (gastroesophageal reflux disease)    PAST HX--NO PROBLEMS NOW-WATCHES DIET  . 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 Hepatic steatosis 04/17/10  . Hx of adenomatous colonic polyps   . Hyperlipemia   . Hypertension   . Pain    LEFT KNEE     Past Surgical History:  Procedure Laterality Date  . ABDOMINAL HYSTERECTOMY    . APPENDECTOMY    . JOINT REPLACEMENT     BILATERAL KNEE REPLACEMENTS  . KNEE ARTHROSCOPY  05/25/2012   Procedure: ARTHROSCOPY KNEE;  Surgeon: Gearlean Alf, MD;  Location: [facility];  Service: Orthopedics;  Laterality: Left;  SYNOVECTOMY  . LEFT HEART CATH AND CORONARY ANGIOGRAPHY N/A 05/23/2018   Procedure: LEFT HEART CATH AND CORONARY ANGIOGRAPHY;  Surgeon: Nigel Mormon, MD;  Location: [place] [facility];  Service: Cardiovascular;  Laterality: N/A;  . TONSILLECTOMY     AND ADNOIDECTOMY      Social History   Socioeconomic History  . Marital status: Married    Spouse name: Not on file  . Number of children: 4  . Years of education: Not on file  . Highest education level: Not on file  Occupational History  . Occupation: Agricultural engineer  Social Needs  . Financial resource strain: Not on file  . Food insecurity    Worry: Not on file    Inability: Not on file  . Transportation needs    Medical: Not on file    Non-medical: Not on file  Tobacco Use  . Smoking status: Never Smoker  . Smokeless tobacco: Never Used  Substance and Sexual Activity  . Alcohol use: No  . Drug use: No  . Sexual activity: Not on file  Lifestyle  . Physical activity    Days per week: Not on file    Minutes per session: Not on file  . Stress: Not on file  Relationships  . Social Herbalist on phone: Not on file    Gets together: Not on file    Attends religious service: Not on file    Active member of club or organization: Not on file    Attends meetings of clubs or organizations: Not on file    Relationship status: Not on file  . Intimate partner violence    Fear of current or ex partner: Not on file  Emotionally abused: Not on file    Physically abused: Not on file    Forced sexual activity: Not on file  Other Topics Concern  . Not on file  Social History Narrative  . Not on file     Family History  Problem Relation Age of Onset  . Diabetes Father   . Heart attack Father   . Colon cancer Daughter      Current Outpatient Medications on File Prior to Visit  Medication Sig Dispense Refill  . ALPRAZolam (XANAX) 0.5 MG tablet Take 0.5 mg by mouth 2 (two) times daily as needed for anxiety.    Marland Kitchen aspirin EC 81 MG tablet Take 81 mg by mouth daily.    Marland Kitchen atorvastatin (LIPITOR) 40 MG tablet Take 1 tablet (40 mg total) by mouth daily. 30 tablet 11  . B Complex-C (B-COMPLEX WITH VITAMIN C) tablet Take 1 tablet by mouth daily.    Marland Kitchen BIOTIN PO Take 1 tablet by mouth daily.      . calcium-vitamin D (OSCAL WITH D) 500-200 MG-UNIT per tablet Take 1 tablet by mouth daily.    Marland Kitchen co-enzyme Q-10 30 MG capsule Take 30 mg by mouth daily.     . diphenhydrAMINE (BENADRYL) 25 MG tablet Take 25 mg by mouth. ONE EVERY NIGHT    . escitalopram (LEXAPRO) 10 MG tablet Take 5 mg by mouth daily.     Marland Kitchen glucosamine-chondroitin 500-400 MG tablet Take 1 tablet by mouth daily.    . metoprolol succinate (TOPROL-XL) 50 MG 24 hr tablet Take 1 tablet (50 mg total) by mouth daily. 90 tablet 3  . Multiple Vitamin (MULTIVITAMIN WITH MINERALS) TABS Take 1 tablet by mouth daily.    . nitroGLYCERIN (NITROSTAT) 0.4 MG SL tablet Place 1 tablet (0.4 mg total) under the tongue every 5 (five) minutes as needed for chest pain. 30 tablet 3  . Omega-3 Fatty Acids (FISH OIL) 1200 MG CAPS Take 1 capsule by mouth daily.    Marland Kitchen omeprazole (PRILOSEC) 20 MG capsule TAKE ONE CAPSULE BY MOUTH EVERY DAY (Patient taking differently: Take 20 mg by mouth daily. ) 90 capsule 0  . psyllium (METAMUCIL) 58.6 % powder Take 1 packet by mouth. EVERY NIGHT    . ticagrelor (BRILINTA) 90 MG TABS tablet Take 1 tablet (90 mg total) by mouth 2 (two) times daily. 60 tablet 3  . TURMERIC PO Take 1 tablet by mouth daily.     . valsartan-hydrochlorothiazide (DIOVAN-HCT) 320-25 MG per tablet Take 1 tablet by mouth daily before breakfast.    . vitamin C (ASCORBIC ACID) 500 MG tablet Take 500 mg by mouth daily.    . vitamin E 400 UNIT capsule Take 400 Units by mouth daily.     No current facility-administered medications on file prior to visit.     Cardiovascular studies:  EKG 01/13/2019: Sinus rhythm 63 bpm.  RSR(V1) -nondiagnostic.  Possible old inferior infarct  Echocardiogram 08/08/2018: Left ventricle cavity is normal in size. Mild concentric hypertrophy of the left ventricle. Normal global wall motion. Indeterminate diastolic filling pattern. Calculated EF 63%. Mild (Grade I) mitral regurgitation. Inadequate TR jet to estimate  pulmonary artery systolic pressure. Normal right atrial pressure. Compared to previous study on 05/21/2018, LVEF has significantly improved. Apical ballooning has resolved.   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 Antegrade right vertebral artery flow. Antegrade left vertebral artery flow. Follow up  is appropriate when clinically indicated.  Coronary angiography 05/23/2018: LM: Normal LAD: Prox-mid 30% stenosis Small D1 with ostial 90% stenosis LCx: Large, codominant. prox focal 50% stenosis RCA: Medium caliber. Prox 30%, mid 50% stenosis (Both focal) LVEDP 20 mmHg Conclusion: Nonobstructive coronary artery disease Patient's coronary anatomy and mild to moderate nonobstructive disease does not corroborate with her profound echocardiogram abnormalities showing apical ballooning. This is most likely consistent with stress induced cardiomyopathy.  Hospital echocardiogram 05/21/2018: - Left ventricle: The cavity size was normal. Wall thickness was normal. Systolic function was mildly to moderately reduced. The estimated ejection fraction was in the range of 40% to 45%. Severe hypokinesis of mid to distal anteroseptal, apical, anteroapical, inferoapical walls. Differentials include mid to distal LAD infarct versus Takotsubo cardiomyopathy. Doppler parameters are consistent with abnormal left ventricular relaxation (grade 1 diastolic dysfunction). - No significant valvular abnormality. No TR jet to measure PA pressure. Normal right atrial pressure.  Hospital EKG 05/23/2018: Sinus rhyth, 67 bpm. Normal axis. Normal conduction. Possible inferior infarct. Anterolateral T wave inversion, possible ischemia.   Recent labs: Results for TREANA, LACOUR (MRN [id number]) as of 01/13/2019 16:27  Ref. Range 05/24/2018 84:13  BASIC METABOLIC PANEL Unknown Rpt (A)  Sodium Latest Ref Range:  135 - 145 mmol/L 139  Potassium Latest Ref Range: 3.5 - 5.1 mmol/L 3.8  Chloride Latest Ref Range: 98 - 111 mmol/L 105  CO2 Latest Ref Range: 22 - 32 mmol/L 22  Glucose Latest Ref Range: 70 - 99 mg/dL 141 (H)  BUN Latest Ref Range: 8 - 23 mg/dL 12  Creatinine Latest Ref Range: 0.44 - 1.00 mg/dL 0.81  Calcium Latest Ref Range: 8.9 - 10.3 mg/dL 9.2  Anion gap Latest Ref Range: 5 - 15  12  GFR, Est Non African American Latest Ref Range: >60 mL/min >60  GFR, Est African American Latest Ref Range: >60 mL/min >60     Review of Systems  Constitution: Negative for decreased appetite, malaise/fatigue, weight gain and weight loss.  HENT: Negative for congestion.   Eyes: Negative for visual disturbance.  Cardiovascular: Negative for chest pain, dyspnea on exertion, leg swelling, palpitations and syncope.  Respiratory: Negative for cough.   Endocrine: Negative for cold intolerance.  Hematologic/Lymphatic: Does not bruise/bleed easily.  Skin: Negative for itching and rash.  Musculoskeletal: Negative for myalgias.  Gastrointestinal: Negative for abdominal pain, nausea and vomiting.  Genitourinary: Negative for dysuria.  Neurological: Negative for dizziness and weakness.  Psychiatric/Behavioral: The patient is not nervous/anxious.   All other systems reviewed and are negative.       Vitals:   01/13/19 1330  BP: (!) 176/77  Pulse: 63  SpO2: 96%    Body mass index is 27.48 kg/m. Filed Weights   01/13/19 1330  Weight: 140 lb 11.2 oz (63.8 kg)     Objective:   Physical Exam  Constitutional: She is oriented to person, place, and time. She appears well-developed and well-nourished. No distress.  HENT:  Head: Normocephalic and atraumatic.  Eyes: Pupils are equal, round, and reactive to light. Conjunctivae are normal.  Neck: No JVD present.  Cardiovascular: Normal rate, regular rhythm and intact distal pulses.  Pulses:      Carotid pulses are on the left side with bruit.  Pulmonary/Chest: Effort normal and breath sounds normal. She has no wheezes. She has no rales.  Abdominal: Soft. Bowel sounds are normal. There is no rebound.  Musculoskeletal:        General: No edema.  Lymphadenopathy:    She has no cervical adenopathy.  Neurological: She is alert and oriented to person, place, and time. No cranial nerve deficit.  Skin: Skin is warm and dry.  Psychiatric: She has a normal mood and affect.  Nursing note and vitals reviewed.         Assessment & Recommendations:   78 year old Caucasian female with hypertension, prediabetes, hyperlipidemia, GERD, anxiety,NSTEMI and stress induced cardiomyopathy.   Stress cardiomyopathy: Resolved. EF 60-65% in 08/2018.  H/o NSTEMI: GIven moderate CAD, recommend 1 year DAPT with aspirin and Brilinta (till 05/2019) for medical management.  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 Continue medical management.  Hypertension: Suboptimal. Increased metoprolol succinate to 100 mg daily. Telephone visit in 4 weeks. If still uncontrolled, could add amlodipine.  Hyperlipidemia: Continue Lipitor 40 mg  Anxiety, possible depression: Continue escitalopram.    Nigel Mormon, MD [facility] [place] Pager: [phone] Office: [phone] If no answer Cell [phone]

## 2019-03-06 ENCOUNTER — Other Ambulatory Visit: Payer: Self-pay | Admitting: Cardiology

## 2019-03-06 DIAGNOSIS — I10 Essential (primary) hypertension: Secondary | ICD-10-CM

## 2019-03-29 ENCOUNTER — Telehealth: Payer: Self-pay

## 2019-04-22 MED FILL — BRILINTA 90 MG TABLET: 90 | 30 days supply | Qty: 60 | Fill #0

## 2019-05-19 ENCOUNTER — Other Ambulatory Visit: Payer: Self-pay

## 2019-05-19 DIAGNOSIS — I10 Essential (primary) hypertension: Secondary | ICD-10-CM

## 2019-05-19 MED ORDER — TICAGRELOR 90 MG PO TABS: ORAL_TABLET | ORAL | 3 refills | Status: DC

## 2019-06-15 ENCOUNTER — Ambulatory Visit: Payer: Medicare Other | Attending: Internal Medicine

## 2019-06-26 NOTE — Telephone Encounter (Signed)
Patient hasn't scheduled appt yet

## 2019-07-25 ENCOUNTER — Telehealth: Payer: Self-pay

## 2019-07-25 NOTE — Telephone Encounter (Signed)
Patient is aware and will continue to take the 100 mg.

## 2019-07-25 NOTE — Telephone Encounter (Signed)
VM 106 : Patient called and left a message on VM.   Patient stated that she has been on Metoprolol 100mg  since Aug 2020 (her Last OV) She said that when she went to the pharmacy and she was given Metoprolol 25mg   1 1/2 tablets daily. From your last notes back in Aug 2020, she was increased to Metoprolol 100mg  daily, but I don't seen any other changes. Can you please clarify the correct dosage for this patient.

## 2019-07-25 NOTE — Telephone Encounter (Signed)
Metoprolol succinate 100 mg is correct. Please send 90 pills X 3 refills.  Thanks MJP

## 2019-07-25 NOTE — Telephone Encounter (Signed)
Please advise 

## 2019-07-28 ENCOUNTER — Other Ambulatory Visit: Payer: Self-pay | Admitting: Cardiology

## 2019-07-28 ENCOUNTER — Other Ambulatory Visit: Payer: Self-pay

## 2019-07-28 DIAGNOSIS — I10 Essential (primary) hypertension: Secondary | ICD-10-CM

## 2019-07-28 MED ORDER — OMEPRAZOLE 20 MG PO CPDR: 20.0000 mg | DELAYED_RELEASE_CAPSULE | Freq: Every day | ORAL | 0 refills | Status: DC

## 2019-08-18 ENCOUNTER — Other Ambulatory Visit: Payer: Self-pay | Admitting: Cardiology

## 2019-08-18 DIAGNOSIS — I10 Essential (primary) hypertension: Secondary | ICD-10-CM

## 2019-08-18 NOTE — Telephone Encounter (Signed)
I refilled 100 mg daily recently. Lets address at her upcoming OV so there is no confusion.  Thanks MJP

## 2019-08-18 NOTE — Telephone Encounter (Signed)
Please let the patient know

## 2019-08-28 ENCOUNTER — Ambulatory Visit: Payer: Medicare Other | Admitting: Cardiology

## 2019-09-01 ENCOUNTER — Ambulatory Visit: Payer: Medicare Other | Admitting: Cardiology

## 2019-09-01 ENCOUNTER — Other Ambulatory Visit: Payer: Self-pay

## 2019-09-01 ENCOUNTER — Encounter: Payer: Self-pay | Admitting: Cardiology

## 2019-09-01 VITALS — BP 192/76 | HR 65 | Temp 97.7°F | Ht 60.0 in | Wt 154.0 lb

## 2019-09-01 DIAGNOSIS — I5181 Takotsubo syndrome: Secondary | ICD-10-CM

## 2019-09-01 DIAGNOSIS — R0609 Other forms of dyspnea: Secondary | ICD-10-CM

## 2019-09-01 DIAGNOSIS — I2510 Atherosclerotic heart disease of native coronary artery without angina pectoris: Secondary | ICD-10-CM

## 2019-09-01 DIAGNOSIS — I10 Essential (primary) hypertension: Secondary | ICD-10-CM

## 2019-09-01 NOTE — Progress Notes (Signed)
Follow up visit  Subjective:   Paige [name], female    DOB: [birth year], 79 y.o.   MRN: [id number]   Chief Complaint  Patient presents with  . Hypertension  . Coronary Artery Disease  . 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  In March 2020, echocardiogram showed normalization of LV wall motion and LVEF.  Patient was recently started on Metformin and pioglitazone for diabetes by her PCP Dr. Osborne Casco.  Today, patient endorses a lot of stress related to her husband's health.  She has experienced some recent shortness of breath on exertion, but denies any chest pain, orthopnea, PND, leg edema.     Current Outpatient Medications on File Prior to Visit  Medication Sig Dispense Refill  . aspirin EC 81 MG tablet Take 81 mg by mouth daily.    Marland Kitchen atorvastatin (LIPITOR) 40 MG tablet Take 1 tablet (40 mg total) by mouth daily. 30 tablet 11  . B Complex-C (B-COMPLEX WITH VITAMIN C) tablet Take 1 tablet by mouth daily.    Marland Kitchen BIOTIN PO Take 1 tablet by mouth daily.     . calcium-vitamin D (OSCAL WITH D) 500-200 MG-UNIT per tablet Take 1 tablet by mouth daily.    Marland Kitchen co-enzyme Q-10 30 MG capsule Take 30 mg by mouth daily.     . diphenhydrAMINE (BENADRYL) 25 MG tablet Take 25 mg by mouth. ONE EVERY NIGHT    . escitalopram (LEXAPRO) 10 MG tablet Take 5 mg by mouth daily.     Marland Kitchen glucosamine-chondroitin 500-400 MG tablet Take 1 tablet by mouth daily.    . metFORMIN (GLUCOPHAGE) 500 MG tablet Take 500 mg by mouth 2 (two) times daily.    . metoprolol succinate (TOPROL-XL) 100 MG 24 hr tablet TAKE 1 TABLET(100 MG) BY MOUTH DAILY 60 tablet 2  . Multiple Vitamin (MULTIVITAMIN WITH MINERALS) TABS Take 1 tablet by mouth daily.    . nitroGLYCERIN (NITROSTAT) 0.4 MG SL tablet Place 1 tablet (0.4 mg total) under the tongue every 5 (five) minutes as needed for chest pain. 30 tablet 3  .  Omega-3 Fatty Acids (FISH OIL) 1200 MG CAPS Take 1 capsule by mouth daily.    Marland Kitchen omeprazole (PRILOSEC) 20 MG capsule Take 1 capsule (20 mg total) by mouth daily. 90 capsule 0  . pioglitazone (ACTOS) 15 MG tablet Take 15 mg by mouth daily.    . psyllium (METAMUCIL) 58.6 % powder Take 1 packet by mouth. EVERY NIGHT    . ticagrelor (BRILINTA) 90 MG TABS tablet TAKE 1 TABLET(90 MG) BY MOUTH TWICE DAILY 180 tablet 3  . TURMERIC PO Take 1 tablet by mouth daily.     . valsartan-hydrochlorothiazide (DIOVAN-HCT) 320-25 MG per tablet Take 1 tablet by mouth daily before breakfast.    . vitamin C (ASCORBIC ACID) 500 MG tablet Take 500 mg by mouth daily.    . vitamin E 400 UNIT capsule Take 400 Units by mouth daily.     No current facility-administered medications on file prior to visit.    Cardiovascular studies:  EKG 09/01/2019:  Sinus rhythm 67 bpm. Possible old inferior infarct. Nonspecific ST-T changes anteroseptal leads.  Echocardiogram 08/08/2018: Left ventricle cavity is normal in size. Mild concentric hypertrophy of the left ventricle. Normal global wall motion. Indeterminate diastolic filling pattern. Calculated EF 63%. Mild (Grade I) mitral regurgitation. Inadequate TR jet to estimate pulmonary artery systolic pressure. Normal right atrial pressure. Compared  to previous study on 05/21/2018, LVEF has significantly improved. Apical ballooning has resolved.   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 Antegrade right vertebral artery flow. Antegrade left vertebral artery flow. Follow up  is appropriate when clinically indicated.  Coronary angiography 05/23/2018: LM: Normal LAD: Prox-mid 30% stenosis Small D1 with ostial 90% stenosis LCx: Large, codominant. prox focal 50% stenosis RCA: Medium caliber. Prox 30%, mid 50% stenosis (Both focal) LVEDP 20 mmHg Conclusion: Nonobstructive coronary artery  disease Patient's coronary anatomy and mild to moderate nonobstructive disease does not corroborate with her profound echocardiogram abnormalities showing apical ballooning. This is most likely consistent with stress induced cardiomyopathy.   Recent labs: 08/14/2019: Glucose 231, BUN/Cr 22/0.9. EGFR 60. HbA1C 8.3% Chol 137, TG 183, HDL 39, LDL 61 TSH 1.4 normal   Review of Systems  Cardiovascular: Negative for chest pain, dyspnea on exertion, leg swelling, palpitations and syncope.        Vitals:   09/01/19 1306  BP: (!) 197/75  Pulse: 65  Temp: 97.7 F (36.5 C)  SpO2: 99%     Body mass index is 30.08 kg/m. Filed Weights   09/01/19 1306  Weight: 154 lb (69.9 kg)     Objective:   Physical Exam  Constitutional: She appears well-developed and well-nourished.  Neck: No JVD present.  Cardiovascular: Normal rate, regular rhythm, normal heart sounds and intact distal pulses.  No murmur heard. Pulmonary/Chest: Effort normal and breath sounds normal. She has no wheezes. She has no rales.  Musculoskeletal:        General: No edema.  Nursing note and vitals reviewed.         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  Stress cardiomyopathy: Resolved. EF 60-65% in 08/2018. Clinically, she is euvolemic on exam. However, given recent shortness of breath in the setting of stress, will repeat an echocardiogram.   H/o NSTEMI: Completed 1 year of DAPT.  Stop Brilinta today. Continue aspirin and statin.    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 Continue medical management.  Hypertension: Elevated, probably due to stress.  Continue current management.  Arrange for remote patient monitoring for hypertension.  If remains elevated, will add amlodipine.     Hyperlipidemia: Continue Lipitor 40 mg   Nigel Mormon, MD [facility] Pager: [phone] Office: [phone] If no answer Cell [phone]

## 2019-09-07 ENCOUNTER — Ambulatory Visit: Payer: Medicare Other

## 2019-09-07 ENCOUNTER — Other Ambulatory Visit: Payer: Self-pay

## 2019-09-07 DIAGNOSIS — R0609 Other forms of dyspnea: Secondary | ICD-10-CM

## 2019-09-14 ENCOUNTER — Telehealth: Payer: Self-pay | Admitting: Pharmacist

## 2019-09-14 DIAGNOSIS — I10 Essential (primary) hypertension: Secondary | ICD-10-CM

## 2019-09-14 MED ORDER — AMLODIPINE BESYLATE 5 MG PO TABS: 5.0000 mg | ORAL_TABLET | Freq: Every day | ORAL | 2 refills | Status: DC

## 2019-09-14 NOTE — Telephone Encounter (Signed)
BP readings reviewed. BP remains elevated but trending down. SBP remains in 160s. Last OV w/ Dr.  Virgina Jock on 09/01/19. Relevant home meds include metoprolol 100 mg, valsartan-HCTZ 320/25 mg daily. Next OV scheduled for 10/04/19. Called to review BP readings with pt. Pt states that she is doing well and tolerating her medications without any issues or complains. Daughter still in hospice but husband recently got discharged and has been home. Having family support to help pt manage husband needs. Pt and husband going on 15 mins walk twice a day and walking about 2 blocks around the neighborhood.  May benefit from adding amlodipine 5 mg for further BP control. Discussed with Dr. Virgina Jock and agreed with the addition of amlodipine. Pt has a new pharmacy; [facility] on [address] [phone]), amlodipine sent to new pharmacy.

## 2019-10-04 ENCOUNTER — Telehealth: Payer: Medicare Other | Admitting: Cardiology

## 2019-10-06 ENCOUNTER — Other Ambulatory Visit: Payer: Self-pay

## 2019-10-06 DIAGNOSIS — R0609 Other forms of dyspnea: Secondary | ICD-10-CM

## 2019-10-06 MED ORDER — FUROSEMIDE 20 MG PO TABS: 20.0000 mg | ORAL_TABLET | Freq: Every day | ORAL | 3 refills | Status: DC

## 2019-10-06 NOTE — Progress Notes (Signed)
done

## 2019-10-28 NOTE — Progress Notes (Signed)
Follow up visit   Subjective:   Paige [name], female    DOB: [birth year], 79 y.o.   MRN: [id number]   Chief Complaint  Patient presents with  . 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  Patient is doing well and denies chest pain, shortness of breath, palpitations, leg edema, orthopnea, PND, TIA/syncope. She has been out of metoprolol succinate 100 mg for several days, but her blood pressure remains well controlled.    Current Outpatient Medications on File Prior to Visit  Medication Sig Dispense Refill  . amLODipine (NORVASC) 5 MG tablet Take 1 tablet (5 mg total) by mouth daily. 30 tablet 2  . aspirin EC 81 MG tablet Take 81 mg by mouth daily.    Marland Kitchen atorvastatin (LIPITOR) 40 MG tablet Take 1 tablet (40 mg total) by mouth daily. 30 tablet 11  . B Complex-C (B-COMPLEX WITH VITAMIN C) tablet Take 1 tablet by mouth daily.    Marland Kitchen BIOTIN PO Take 1 tablet by mouth daily.     . calcium-vitamin D (OSCAL WITH D) 500-200 MG-UNIT per tablet Take 1 tablet by mouth daily.    Marland Kitchen co-enzyme Q-10 30 MG capsule Take 30 mg by mouth daily.     . diphenhydrAMINE (BENADRYL) 25 MG tablet Take 25 mg by mouth. ONE EVERY NIGHT    . escitalopram (LEXAPRO) 10 MG tablet Take 5 mg by mouth daily.     . furosemide (LASIX) 20 MG tablet Take 1 tablet (20 mg total) by mouth daily. 90 tablet 3  . glucosamine-chondroitin 500-400 MG tablet Take 1 tablet by mouth daily.    . metFORMIN (GLUCOPHAGE) 500 MG tablet Take 500 mg by mouth 2 (two) times daily.    . metoprolol succinate (TOPROL-XL) 100 MG 24 hr tablet TAKE 1 TABLET(100 MG) BY MOUTH DAILY 60 tablet 2  . Multiple Vitamin (MULTIVITAMIN WITH MINERALS) TABS Take 1 tablet by mouth daily.    . nitroGLYCERIN (NITROSTAT) 0.4 MG SL tablet Place 1 tablet (0.4 mg total) under the tongue every 5 (five) minutes as needed for chest pain. 30  tablet 3  . Omega-3 Fatty Acids (FISH OIL) 1200 MG CAPS Take 1 capsule by mouth daily.    Marland Kitchen omeprazole (PRILOSEC) 20 MG capsule Take 1 capsule (20 mg total) by mouth daily. 90 capsule 0  . pioglitazone (ACTOS) 15 MG tablet Take 15 mg by mouth daily.    . psyllium (METAMUCIL) 58.6 % powder Take 1 packet by mouth. EVERY NIGHT    . TURMERIC PO Take 1 tablet by mouth daily.     . valsartan-hydrochlorothiazide (DIOVAN-HCT) 320-25 MG per tablet Take 1 tablet by mouth daily before breakfast.    . vitamin C (ASCORBIC ACID) 500 MG tablet Take 500 mg by mouth daily.    . vitamin E 400 UNIT capsule Take 400 Units by mouth daily.     No current facility-administered medications on file prior to visit.    Cardiovascular studies:  Echocardiogram 09/07/2019:  Left ventricle cavity is normal in size and wall thickness. Normal global  wall motion. Normal LV systolic function with visual EF 50-55%. Doppler  evidence of grade II (pseudonormal) diastolic dysfunction, elevated LAP.  Mild to moderate mitral regurgitation.  Mild tricuspid regurgitation. Estimated pulmonary artery systolic pressure  is 25 mmHg.  Trivial pericardial effusion.  Compared to previous study on 08/09/5454, diastolic dysfunction increased  from grade I to  II. Trivial pericardial effusion is new.   EKG 09/01/2019:  Sinus rhythm 67 bpm. Possible old inferior infarct. Nonspecific ST-T changes anteroseptal leads.  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 Antegrade right vertebral artery flow. Antegrade left vertebral artery flow. Follow up  is appropriate when clinically indicated.  Coronary angiography 05/23/2018: LM: Normal LAD: Prox-mid 30% stenosis Small D1 with ostial 90% stenosis LCx: Large, codominant. prox focal 50% stenosis RCA: Medium caliber. Prox 30%, mid 50% stenosis (Both focal) LVEDP 20 mmHg Conclusion: Nonobstructive  coronary artery disease Patient's coronary anatomy and mild to moderate nonobstructive disease does not corroborate with her profound echocardiogram abnormalities showing apical ballooning. This is most likely consistent with stress induced cardiomyopathy.   Recent labs: 08/14/2019: Glucose 231, BUN/Cr 22/0.9. EGFR 60. HbA1C 8.3% Chol 137, TG 183, HDL 39, LDL 61 TSH 1.4 normal   Review of Systems  Cardiovascular: Negative for chest pain, dyspnea on exertion, leg swelling, palpitations and syncope.        Vitals:   10/30/19 1312  BP: (!) 137/54  Pulse: 72  Resp: 17  SpO2: 96%     Body mass index is 27.73 kg/m. Filed Weights   10/30/19 1312  Weight: 142 lb (64.4 kg)     Objective:   Physical Exam  Constitutional: She appears well-developed and well-nourished.  Neck: No JVD present.  Cardiovascular: Normal rate, regular rhythm, normal heart sounds and intact distal pulses.  No murmur heard. Pulmonary/Chest: Effort normal and breath sounds normal. She has no wheezes. She has no rales.  Musculoskeletal:        General: No edema.  Nursing note and vitals reviewed.       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  Stress cardiomyopathy: Resolved. EF 60-65% in 08/2018.  CAD: Nonobstructive. Continue aspirin and statin.    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 Continue medical management.  Hypertension: Well controlled even without metoprolol succinate 100 mg. Okay to stop.   Hyperlipidemia: Continue Lipitor 40 mg  Nigel Mormon, MD [facility] Pager: [phone] Office: [phone] If no answer Cell [phone]

## 2019-10-30 ENCOUNTER — Telehealth: Payer: Medicare Other | Admitting: Cardiology

## 2019-10-30 ENCOUNTER — Encounter: Payer: Self-pay | Admitting: Cardiology

## 2019-10-30 ENCOUNTER — Other Ambulatory Visit: Payer: Self-pay

## 2019-10-30 VITALS — BP 137/54 | HR 72 | Resp 17 | Ht 60.0 in | Wt 142.0 lb

## 2019-10-30 DIAGNOSIS — I10 Essential (primary) hypertension: Secondary | ICD-10-CM

## 2019-10-30 DIAGNOSIS — I252 Old myocardial infarction: Secondary | ICD-10-CM

## 2019-10-30 DIAGNOSIS — I5181 Takotsubo syndrome: Secondary | ICD-10-CM

## 2019-12-06 ENCOUNTER — Other Ambulatory Visit: Payer: Self-pay | Admitting: Cardiology

## 2019-12-06 DIAGNOSIS — I10 Essential (primary) hypertension: Secondary | ICD-10-CM

## 2020-01-15 ENCOUNTER — Encounter: Payer: Self-pay | Admitting: Physician Assistant

## 2020-02-05 ENCOUNTER — Encounter: Payer: Self-pay | Admitting: Physician Assistant

## 2020-02-22 ENCOUNTER — Other Ambulatory Visit: Payer: Self-pay

## 2020-02-22 MED ORDER — OMEPRAZOLE 20 MG PO CPDR: 20.0000 mg | DELAYED_RELEASE_CAPSULE | Freq: Every day | ORAL | 2 refills | Status: AC

## 2020-05-05 NOTE — Progress Notes (Signed)
Follow up visit   Subjective:   Paige [name], female    DOB: 04/14/1941, 79 y.o.   MRN: [id number]   No chief complaint on file.   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  Patient is doing well and denies chest pain, shortness of breath, palpitations, leg edema, orthopnea, PND, TIA/syncope. She has been out of metoprolol succinate 100 mg for several days, but her blood pressure remains well controlled.    Current Outpatient Medications on File Prior to Visit  Medication Sig Dispense Refill  . amLODipine (NORVASC) 5 MG tablet TAKE 1 TABLET BY MOUTH EVERY DAY 90 tablet 2  . aspirin EC 81 MG tablet Take 81 mg by mouth daily.    Marland Kitchen atorvastatin (LIPITOR) 40 MG tablet Take 1 tablet (40 mg total) by mouth daily. 30 tablet 11  . B Complex-C (B-COMPLEX WITH VITAMIN C) tablet Take 1 tablet by mouth daily.    Marland Kitchen BIOTIN PO Take 1 tablet by mouth daily.     . calcium-vitamin D (OSCAL WITH D) 500-200 MG-UNIT per tablet Take 1 tablet by mouth daily.    Marland Kitchen co-enzyme Q-10 30 MG capsule Take 30 mg by mouth daily.     . diphenhydrAMINE (BENADRYL) 25 MG tablet Take 25 mg by mouth. ONE EVERY NIGHT    . escitalopram (LEXAPRO) 10 MG tablet Take 5 mg by mouth daily.     . furosemide (LASIX) 20 MG tablet Take 1 tablet (20 mg total) by mouth daily. 90 tablet 3  . glucosamine-chondroitin 500-400 MG tablet Take 1 tablet by mouth daily.    . metFORMIN (GLUCOPHAGE) 500 MG tablet Take 500 mg by mouth 2 (two) times daily.    . Multiple Vitamin (MULTIVITAMIN WITH MINERALS) TABS Take 1 tablet by mouth daily.    . nitroGLYCERIN (NITROSTAT) 0.4 MG SL tablet Place 1 tablet (0.4 mg total) under the tongue every 5 (five) minutes as needed for chest pain. 30 tablet 3  . Omega-3 Fatty Acids (FISH OIL) 1200 MG CAPS Take 1 capsule by mouth daily.    Marland Kitchen omeprazole (PRILOSEC) 20 MG capsule Take 1 capsule (20 mg  total) by mouth daily. 90 capsule 2  . pioglitazone (ACTOS) 15 MG tablet Take 15 mg by mouth daily.    . TURMERIC PO Take 1 tablet by mouth daily.     . valsartan-hydrochlorothiazide (DIOVAN-HCT) 320-25 MG per tablet Take 1 tablet by mouth daily before breakfast.    . vitamin C (ASCORBIC ACID) 500 MG tablet Take 500 mg by mouth daily.    . vitamin E 400 UNIT capsule Take 400 Units by mouth daily.     No current facility-administered medications on file prior to visit.    Cardiovascular studies:  Echocardiogram 09/07/2019:  Left ventricle cavity is normal in size and wall thickness. Normal global  wall motion. Normal LV systolic function with visual EF 50-55%. Doppler  evidence of grade II (pseudonormal) diastolic dysfunction, elevated LAP.  Mild to moderate mitral regurgitation.  Mild tricuspid regurgitation. Estimated pulmonary artery systolic pressure  is 25 mmHg.  Trivial pericardial effusion.  Compared to previous study on 0/08/1592, diastolic dysfunction increased  from grade I to II. Trivial pericardial effusion is new.   EKG 09/01/2019:  Sinus rhythm 67 bpm. Possible old inferior infarct. Nonspecific ST-T changes anteroseptal leads.  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 Antegrade right  vertebral artery flow. Antegrade left vertebral artery flow. Follow up  is appropriate when clinically indicated.  Coronary angiography 05/23/2018: LM: Normal LAD: Prox-mid 30% stenosis Small D1 with ostial 90% stenosis LCx: Large, codominant. prox focal 50% stenosis RCA: Medium caliber. Prox 30%, mid 50% stenosis (Both focal) LVEDP 20 mmHg Conclusion: Nonobstructive coronary artery disease Patient's coronary anatomy and mild to moderate nonobstructive disease does not corroborate with her profound echocardiogram abnormalities showing apical ballooning. This is most likely consistent with stress  induced cardiomyopathy.   Recent labs: 08/14/2019: Glucose 231, BUN/Cr 22/0.9. EGFR 60. HbA1C 8.3% Chol 137, TG 183, HDL 39, LDL 61 TSH 1.4 normal   Review of Systems  Cardiovascular: Negative for chest pain, dyspnea on exertion, leg swelling, palpitations and syncope.        There were no vitals filed for this visit.   There is no height or weight on file to calculate BMI. There were no vitals filed for this visit.   Objective:   Physical Exam Vitals and nursing note reviewed.  Constitutional:      Appearance: She is well-developed.  Neck:     Vascular: No JVD.  Cardiovascular:     Rate and Rhythm: Normal rate and regular rhythm.     Pulses: Intact distal pulses.     Heart sounds: Normal heart sounds. No murmur heard.   Pulmonary:     Effort: Pulmonary effort is normal.     Breath sounds: Normal breath sounds. No wheezing or rales.         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  Stress cardiomyopathy: Resolved. EF 60-65% in 08/2018.  CAD: Nonobstructive. Continue aspirin and statin.    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 Continue medical management.  Hypertension: Well controlled even without metoprolol succinate 100 mg. Okay to stop.   Hyperlipidemia: Continue Lipitor 40 mg  Nigel Mormon, MD [facility] Pager: [phone] Office: [phone] If no answer Cell [phone]

## 2020-05-06 ENCOUNTER — Ambulatory Visit: Payer: Medicare Other | Admitting: Cardiology

## 2020-07-31 ENCOUNTER — Other Ambulatory Visit: Payer: Self-pay | Admitting: Cardiology

## 2020-07-31 DIAGNOSIS — I10 Essential (primary) hypertension: Secondary | ICD-10-CM

## 2020-08-28 ENCOUNTER — Other Ambulatory Visit: Payer: Self-pay

## 2020-08-28 MED ORDER — ATORVASTATIN CALCIUM 40 MG PO TABS: 40.0000 mg | ORAL_TABLET | Freq: Every day | ORAL | 6 refills | Status: DC

## 2020-09-10 ENCOUNTER — Other Ambulatory Visit: Payer: Self-pay | Admitting: Cardiology

## 2020-09-10 DIAGNOSIS — I10 Essential (primary) hypertension: Secondary | ICD-10-CM

## 2020-11-05 ENCOUNTER — Encounter: Payer: Self-pay | Admitting: *Deleted

## 2020-11-05 ENCOUNTER — Ambulatory Visit: Payer: Medicare Other | Admitting: Diagnostic Neuroimaging

## 2020-11-05 VITALS — BP 96/54 | HR 56 | Ht 60.5 in | Wt 142.6 lb

## 2020-11-05 DIAGNOSIS — G3184 Mild cognitive impairment, so stated: Secondary | ICD-10-CM

## 2020-11-05 NOTE — Progress Notes (Signed)
[facility]  PATIENT: Paige [name] DOB: June 12, 1940  REFERRING CLINICIAN: Tisovec, Fransico Him, MD HISTORY FROM: patient  REASON FOR VISIT: new consult    HISTORICAL  CHIEF COMPLAINT:  Chief Complaint  Patient presents with  . Memory Loss    Rm 6 husbandHubbard Robinson  MMSE 24    HISTORY OF PRESENT ILLNESS:   80 year old female here for evaluation of mild memory loss.  Patient reports at least 1 year of mild memory loss, such as losing items at home.  No major changes in ADLs.  She stopped driving 4 years ago because she felt nervous on the road with high amounts of traffic.  Still able to manage household affairs and large holiday meals.  Takes care of her own ADLs.   REVIEW OF SYSTEMS: Full 14 system review of systems performed and negative with exception of: as per HPI.    ALLERGIES: Allergies  Allergen Reactions  . Oxycodone Nausea Only    SEVERE NAUSEA  . Metformin     Other reaction(s): diarrhea  . Chlorhexidine Itching    ITCHING  . Hydrocodone Nausea Only    SEVERE NAUSEA  . Other Rash    MANGOS -RASH LIKE POISON IVY CASHEWS- MOUTH SWELLS POISON IVY-RASH NOTE:  PT STATES SHE CAN TAKE TRAMADOL FOR PAIN  . Sulfa Antibiotics Hives    Face bright red    HOME MEDICATIONS: Outpatient Medications Prior to Visit  Medication Sig Dispense Refill  . amLODipine (NORVASC) 5 MG tablet TAKE 1 TABLET BY MOUTH EVERY DAY 90 tablet 0  . aspirin EC 81 MG tablet Take 81 mg by mouth daily.    Marland Kitchen atorvastatin (LIPITOR) 40 MG tablet Take 1 tablet (40 mg total) by mouth daily. 30 tablet 6  . B Complex-C (B-COMPLEX WITH VITAMIN C) tablet Take 1 tablet by mouth daily.    Marland Kitchen BIOTIN PO Take 1 tablet by mouth daily.     . calcium-vitamin D (OSCAL WITH D) 500-200 MG-UNIT per tablet Take 1 tablet by mouth daily.    Marland Kitchen co-enzyme Q-10 30 MG capsule Take 30 mg by mouth daily.     Marland Kitchen escitalopram (LEXAPRO) 10 MG tablet Take 5 mg by mouth daily.     Marland Kitchen JARDIANCE 10 MG TABS  tablet Take 10 mg by mouth daily.    . metoprolol succinate (TOPROL-XL) 50 MG 24 hr tablet Take 50 mg by mouth daily.    . Multiple Vitamin (MULTIVITAMIN WITH MINERALS) TABS Take 1 tablet by mouth daily.    . nitroGLYCERIN (NITROSTAT) 0.4 MG SL tablet Place 1 tablet (0.4 mg total) under the tongue every 5 (five) minutes as needed for chest pain. 30 tablet 3  . Omega-3 Fatty Acids (FISH OIL) 1200 MG CAPS Take 1 capsule by mouth daily.    Marland Kitchen omeprazole (PRILOSEC) 20 MG capsule Take 1 capsule (20 mg total) by mouth daily. 90 capsule 2  . pioglitazone (ACTOS) 15 MG tablet Take 15 mg by mouth daily.    . TURMERIC PO Take 1 tablet by mouth daily.     . valsartan-hydrochlorothiazide (DIOVAN-HCT) 320-25 MG per tablet Take 1 tablet by mouth daily before breakfast.    . vitamin E 400 UNIT capsule Take 400 Units by mouth daily.    . Glucosamine 500 MG CAPS Take 1 capsule by mouth daily.    . diphenhydrAMINE (BENADRYL) 25 MG tablet Take 25 mg by mouth. ONE EVERY NIGHT (Patient not taking: Reported on 11/05/2020)    .  furosemide (LASIX) 20 MG tablet Take 1 tablet (20 mg total) by mouth daily. 90 tablet 3  . glucosamine-chondroitin 500-400 MG tablet Take 1 tablet by mouth daily. (Patient not taking: Reported on 11/05/2020)    . metFORMIN (GLUCOPHAGE) 500 MG tablet Take 500 mg by mouth 2 (two) times daily. (Patient not taking: Reported on 11/05/2020)    . vitamin C (ASCORBIC ACID) 500 MG tablet Take 500 mg by mouth daily. (Patient not taking: Reported on 11/05/2020)     No facility-administered medications prior to visit.    PAST MEDICAL HISTORY: Past Medical History:  Diagnosis Date  . Anxiety   . Arthritis   . Complication of anesthesia    PT STATES THAT SHE WAS ADVISED NOT TO HAVE SPINAL ANESTHESIA FOR HER SECOND KNEE REPLACEMENT SURGERY IN  2011-BECAUSE SHE HAD BACK PROBLEMS-PAIN AND SEVERAL EPIDURALS--STATES DR. Rolena Infante ADVISED AGAINST SPINAL .  PT STATES SHE HAD SORE THROAT FOR 3 DAYS AFTER THE GENERAL  ANESTHESIA FOR  LAST KNEE REPLACEMENT  . Diabetes ([place])   . Diverticulosis    hyperlip  . GERD (gastroesophageal reflux disease)    PAST HX--NO PROBLEMS NOW-WATCHES DIET  . 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 Hepatic steatosis 04/17/10  . Hx of adenomatous colonic polyps   . Hyperlipemia   . Hypertension   . Osteopenia   . Pain    LEFT KNEE    PAST SURGICAL HISTORY: Past Surgical History:  Procedure Laterality Date  . ABDOMINAL HYSTERECTOMY  1980  . APPENDECTOMY    . JOINT REPLACEMENT     BILATERAL KNEE REPLACEMENTS  . KNEE ARTHROSCOPY  05/25/2012   Procedure: ARTHROSCOPY KNEE;  Surgeon: Gearlean Alf, MD;  Location: [facility];  Service: Orthopedics;  Laterality: Left;  SYNOVECTOMY  . LEFT HEART CATH AND CORONARY ANGIOGRAPHY N/A 05/23/2018   Procedure: LEFT HEART CATH AND CORONARY ANGIOGRAPHY;  Surgeon: Nigel Mormon, MD;  Location: [facility];  Service: Cardiovascular;  Laterality: N/A;  . TONSILLECTOMY     AND ADNOIDECTOMY    FAMILY HISTORY: Family History  Problem Relation Age of Onset  . Diabetes Father   . Heart attack Father   . Colon cancer Daughter     SOCIAL HISTORY: Social History   Socioeconomic History  . Marital status: Married    Spouse name: Hubbard Robinson  . Number of children: 4  . Years of education: Not on file  . Highest education level: Some college, no degree  Occupational History  . Occupation: Homemaker  Tobacco Use  . Smoking status: Never Smoker  . Smokeless tobacco: Never Used  Vaping Use  . Vaping Use: Never used  Substance and Sexual Activity  . Alcohol use: Never  . Drug use: Never  . Sexual activity: Not on file  Other Topics Concern  . Not on file  Social History Narrative   11/05/20 lives with husband   Caffeine 1 12 oz daily   Social Determinants of Health   Financial Resource Strain: Not on file  Food Insecurity: Not on file  Transportation Needs: Not on file  Physical Activity: Not on file  Stress:  Not on file  Social Connections: Not on file  Intimate Partner Violence: Not on file     PHYSICAL EXAM  GENERAL EXAM/CONSTITUTIONAL: Vitals:  Vitals:   11/05/20 1329  BP: (!) 96/54  Pulse: (!) 56  Weight: 142 lb 9.6 oz (64.7 kg)  Height: 5' 0.5" (1.537 m)  Body mass index is 27.39 kg/m. Wt Readings from Last 3 Encounters:  11/05/20 142 lb 9.6 oz (64.7 kg)  10/30/19 142 lb (64.4 kg)  09/01/19 154 lb (69.9 kg)     Patient is in no distress; well developed, nourished and groomed; neck is supple  CARDIOVASCULAR:  Examination of carotid arteries is normal; no carotid bruits  Regular rate and rhythm, no murmurs  Examination of peripheral vascular system by observation and palpation is normal  EYES:  Ophthalmoscopic exam of optic discs and posterior segments is normal; no papilledema or hemorrhages  No exam data present  MUSCULOSKELETAL:  Gait, strength, tone, movements noted in Neurologic exam below  NEUROLOGIC: MENTAL STATUS:  MMSE - Lazy Y U Exam 11/05/2020  Orientation to time 5  Orientation to Place 5  Registration 3  Attention/ Calculation 2  Recall 3  Language- name 2 objects 2  Language- repeat 1  Language- follow 3 step command 3  Language- read & follow direction 1  Write a sentence 1  Copy design 0  Total score 26    awake, alert, oriented to person, place and time  recent and remote memory intact  normal attention and concentration  language fluent, comprehension intact, naming intact  fund of knowledge appropriate  CRANIAL NERVE:   2nd - no papilledema on fundoscopic exam  2nd, 3rd, 4th, 6th - pupils equal and reactive to light, visual fields full to confrontation, extraocular muscles intact, no nystagmus  5th - facial sensation symmetric  7th - facial strength symmetric  8th - hearing intact  9th - palate elevates symmetrically, uvula midline  11th - shoulder shrug symmetric  12th - tongue protrusion  midline  MOTOR:   normal bulk and tone, full strength in the BUE, BLE  SENSORY:   normal and symmetric to light touch, temperature, vibration  COORDINATION:   finger-nose-finger, fine finger movements normal  REFLEXES:   deep tendon reflexes TRACE and symmetric  GAIT/STATION:   narrow based gait     DIAGNOSTIC DATA (LABS, IMAGING, TESTING) - I reviewed patient records, labs, notes, testing and imaging myself where available.  Lab Results  Component Value Date   WBC 7.5 05/24/2018   HGB 12.9 05/24/2018   HCT 39.3 05/24/2018   MCV 91.6 05/24/2018   PLT 258 05/24/2018      Component Value Date/Time   NA 139 05/24/2018 0316   K 3.8 05/24/2018 0316   CL 105 05/24/2018 0316   CO2 22 05/24/2018 0316   GLUCOSE 141 (H) 05/24/2018 0316   BUN 12 05/24/2018 0316   CREATININE 0.81 05/24/2018 0316   CALCIUM 9.2 05/24/2018 0316   PROT 7.0 05/21/2018 1431   ALBUMIN 4.1 05/21/2018 1431   AST 44 (H) 05/21/2018 1431   ALT 35 05/21/2018 1431   ALKPHOS 65 05/21/2018 1431   BILITOT 0.7 05/21/2018 1431   GFRNONAA >60 05/24/2018 0316   GFRAA >60 05/24/2018 0316   Lab Results  Component Value Date   CHOL 172 05/22/2018   HDL 41 05/22/2018   LDLCALC 84 05/22/2018   TRIG 236 (H) 05/22/2018   CHOLHDL 4.2 05/22/2018   Lab Results  Component Value Date   HGBA1C 7.2 (H) 05/22/2018   No results found for: VITAMINB12 No results found for: TSH     ASSESSMENT AND PLAN  80 y.o. year old female here with:  Dx:  1. MCI (mild cognitive impairment)       PLAN:  MCI (mild cognitive impairment vs normal aging) -  safety / supervision issues reviewed - daily physical activity / exercise (at least 15-30 minutes) - eat more plants / vegetables - increase social activities, brain stimulation, games, puzzles, hobbies, crafts, arts, music - aim for at least 7-8 hours sleep per night (or more) - avoid smoking and alcohol - caregiver resources provided - caution with  medications, finances, driving  Return for return to PCP, pending if symptoms worsen or fail to improve.    Penni Bombard, MD 09/08/7541, 6:06 PM Certified in Neurology, Neurophysiology and Neuroimaging  [facility] [address], [place], [place] [address] [phone]

## 2020-11-05 NOTE — Patient Instructions (Signed)
MCI (mild memory loss) - safety / supervision issues reviewed - daily physical activity / exercise (at least 15-30 minutes) - eat more plants / vegetables - increase social activities, brain stimulation, games, puzzles, hobbies, crafts, arts, music - aim for at least 7-8 hours sleep per night (or more) - avoid smoking and alcohol - caregiver resources provided - caution with medications, finances, driving

## 2021-02-12 ENCOUNTER — Other Ambulatory Visit: Payer: Self-pay | Admitting: Internal Medicine

## 2021-02-12 DIAGNOSIS — Z1231 Encounter for screening mammogram for malignant neoplasm of breast: Secondary | ICD-10-CM

## 2021-02-14 ENCOUNTER — Ambulatory Visit
Admission: RE | Admit: 2021-02-14 | Discharge: 2021-02-14 | Disposition: A | Discharge status: Home / Self Care | Payer: Medicare Other | Source: Ambulatory Visit | Attending: Internal Medicine | Admitting: Internal Medicine

## 2021-02-14 ENCOUNTER — Other Ambulatory Visit: Payer: Self-pay

## 2021-02-14 DIAGNOSIS — Z1231 Encounter for screening mammogram for malignant neoplasm of breast: Secondary | ICD-10-CM

## 2021-02-22 ENCOUNTER — Other Ambulatory Visit: Payer: Self-pay | Admitting: Cardiology

## 2021-04-07 ENCOUNTER — Encounter: Payer: Self-pay | Admitting: Pharmacist

## 2021-04-07 NOTE — Progress Notes (Signed)
CARE PLAN ENTRY  04/07/2021 Name: Paige [name] MRN: [id number] DOB: [birth year]  Burke Keels is enrolled in Remote Patient Monitoring/Principle Care Monitoring.  Date of Enrollment: 09/01/19 Supervising physician: Vernell Leep Indication: HTN  Remote Readings: Compliant and Avg BP: 122/65, HR:57  Next scheduled OV: 04/23/21  Pharmacist Clinical Goal(s):  Over the next 90 days, patient will demonstrate Improved medication adherence as evidenced by medication fill history Over the next 90 days, patient will demonstrate improved understanding of prescribed medications and rationale for usage as evidenced by patient teach back Over the next 90 days, patient will experience decrease in ED visits. ED visits in last 6 months = 0 Over the next 90 days, patient will not experience hospital admission. Hospital Admissions in last 6 months = 0  Interventions: Provider and Inter-disciplinary care team collaboration (see longitudinal plan of care) Comprehensive medication review performed. Discussed plans with patient for ongoing care management follow up and provided patient with direct contact information for care management team Collaboration with provider re: medication management  Patient Self Care Activities:  Self administers medications as prescribed Attends all scheduled provider appointments Performs ADL's independently Performs IADL's independently  Allergies  Allergen Reactions   Oxycodone Nausea Only    SEVERE NAUSEA   Metformin     Other reaction(s): diarrhea   Chlorhexidine Itching    ITCHING   Hydrocodone Nausea Only    SEVERE NAUSEA   Other Rash    MANGOS -RASH LIKE POISON IVY CASHEWS- MOUTH SWELLS POISON IVY-RASH NOTE:  PT STATES SHE CAN TAKE TRAMADOL FOR PAIN   Sulfa Antibiotics Hives    Face bright red   Outpatient Encounter Medications as of 04/07/2021  Medication Sig   amLODipine (NORVASC) 5 MG tablet TAKE 1 TABLET BY MOUTH EVERY DAY    aspirin EC 81 MG tablet Take 81 mg by mouth daily.   atorvastatin (LIPITOR) 40 MG tablet TAKE 1 TABLET BY MOUTH EVERY DAY   B Complex-C (B-COMPLEX WITH VITAMIN C) tablet Take 1 tablet by mouth daily.   BIOTIN PO Take 1 tablet by mouth daily.    calcium-vitamin D (OSCAL WITH D) 500-200 MG-UNIT per tablet Take 1 tablet by mouth daily.   co-enzyme Q-10 30 MG capsule Take 30 mg by mouth daily.    diphenhydrAMINE (BENADRYL) 25 MG tablet Take 25 mg by mouth. ONE EVERY NIGHT (Patient not taking: Reported on 11/05/2020)   escitalopram (LEXAPRO) 10 MG tablet Take 5 mg by mouth daily.    furosemide (LASIX) 20 MG tablet Take 1 tablet (20 mg total) by mouth daily.   glucosamine-chondroitin 500-400 MG tablet Take 1 tablet by mouth daily. (Patient not taking: Reported on 11/05/2020)   JARDIANCE 10 MG TABS tablet Take 10 mg by mouth daily.   metFORMIN (GLUCOPHAGE) 500 MG tablet Take 500 mg by mouth 2 (two) times daily. (Patient not taking: Reported on 11/05/2020)   metoprolol succinate (TOPROL-XL) 50 MG 24 hr tablet Take 50 mg by mouth daily.   Multiple Vitamin (MULTIVITAMIN WITH MINERALS) TABS Take 1 tablet by mouth daily.   nitroGLYCERIN (NITROSTAT) 0.4 MG SL tablet Place 1 tablet (0.4 mg total) under the tongue every 5 (five) minutes as needed for chest pain.   Omega-3 Fatty Acids (FISH OIL) 1200 MG CAPS Take 1 capsule by mouth daily.   omeprazole (PRILOSEC) 20 MG capsule Take 1 capsule (20 mg total) by mouth daily.   pioglitazone (ACTOS) 15 MG tablet Take 15 mg by mouth daily.   TURMERIC  PO Take 1 tablet by mouth daily.    valsartan-hydrochlorothiazide (DIOVAN-HCT) 320-25 MG per tablet Take 1 tablet by mouth daily before breakfast.   vitamin C (ASCORBIC ACID) 500 MG tablet Take 500 mg by mouth daily. (Patient not taking: Reported on 11/05/2020)   vitamin E 400 UNIT capsule Take 400 Units by mouth daily.   No facility-administered encounter medications on file as of 04/07/2021.    Hypertension   BP goal  is:  <130/80  Office blood pressures are  BP Readings from Last 3 Encounters:  11/05/20 (!) 96/54  10/30/19 (!) 137/54  09/01/19 (!) 192/76    Patient is currently controlled on the following medications: jardiance 10 mg, metoprolol 50 mg, amlodipine 5 mg, lasix 20 mg, valsartan-HCTZ 320/25 mg  Patient checks BP at home daily  Patient home BP readings are ranging: 108-142/54-76  We discussed diet and exercise extensively  Plan  Continue current medications and control with diet and exercise   ______________ Visit Information SDOH (Social Determinants of Health) assessments performed: Yes.  Ms. Margolis was given information about Principle Care Management/Remote Patient Monitoring services today including:  RPM/PCM service includes personalized support from designated clinical staff supervised by her physician, including individualized plan of care and coordination with other care providers 24/7 contact phone numbers for assistance for urgent and routine care needs. Standard insurance, coinsurance, copays and deductibles apply for principle care management only during months in which we provide at least 30 minutes of these services. Most insurances cover these services at 100%, however patients may be responsible for any copay, coinsurance and/or deductible if applicable. This service may help you avoid the need for more expensive face-to-face services. Only one practitioner may furnish and bill the service in a calendar month. The patient may stop PCM/RPM services at any time (effective at the end of the month) by phone call to the office staff.  Patient agreed to services and verbal consent obtained.   Manuela Schwartz, Pharm.D. [facility] [phone] Ext: 120

## 2021-04-08 ENCOUNTER — Ambulatory Visit: Payer: Self-pay | Admitting: Cardiology

## 2021-04-09 ENCOUNTER — Ambulatory Visit: Payer: Self-pay | Admitting: Cardiology

## 2021-04-23 ENCOUNTER — Encounter: Payer: Self-pay | Admitting: Cardiology

## 2021-04-23 ENCOUNTER — Other Ambulatory Visit: Payer: Self-pay

## 2021-04-23 ENCOUNTER — Ambulatory Visit: Payer: Medicare Other | Admitting: Cardiology

## 2021-04-23 VITALS — BP 135/65 | HR 56 | Temp 98.0°F | Resp 16 | Ht 60.0 in | Wt 139.0 lb

## 2021-04-23 DIAGNOSIS — R600 Localized edema: Secondary | ICD-10-CM | POA: Insufficient documentation

## 2021-04-23 DIAGNOSIS — R0609 Other forms of dyspnea: Secondary | ICD-10-CM | POA: Insufficient documentation

## 2021-04-23 DIAGNOSIS — I10 Essential (primary) hypertension: Secondary | ICD-10-CM

## 2021-04-23 MED ORDER — FUROSEMIDE 20 MG PO TABS: 20.0000 mg | ORAL_TABLET | Freq: Every day | ORAL | 3 refills | Status: DC | PRN

## 2021-04-23 NOTE — Progress Notes (Signed)
Follow up visit   Subjective:   Paige [name], female    DOB: Oct 30, 1940, 80 y.o.   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  Patient is here for follow-up visit.  She has noticed bilateral leg edema, for which she wears compression socks, not medical grade compression stockings.  She also has mild exertional dyspnea, but denies any chest pain or any other symptoms.  Blood pressures well controlled.  Current Outpatient Medications on File Prior to Visit  Medication Sig Dispense Refill   amLODipine (NORVASC) 5 MG tablet TAKE 1 TABLET BY MOUTH EVERY DAY 90 tablet 0   aspirin EC 81 MG tablet Take 81 mg by mouth daily.     atorvastatin (LIPITOR) 40 MG tablet TAKE 1 TABLET BY MOUTH EVERY DAY 90 tablet 2   B Complex-C (B-COMPLEX WITH VITAMIN C) tablet Take 1 tablet by mouth daily.     BIOTIN PO Take 1 tablet by mouth daily.      calcium-vitamin D (OSCAL WITH D) 500-200 MG-UNIT per tablet Take 1 tablet by mouth daily.     co-enzyme Q-10 30 MG capsule Take 30 mg by mouth daily.      diclofenac Sodium (VOLTAREN) 1 % GEL SMARTSIG:Sparingly T-DERMAL 4 Times Daily     diphenhydrAMINE (BENADRYL) 25 MG tablet Take 25 mg by mouth. ONE EVERY NIGHT     escitalopram (LEXAPRO) 10 MG tablet Take 5 mg by mouth daily.      glucosamine-chondroitin 500-400 MG tablet Take 1 tablet by mouth daily.     JARDIANCE 10 MG TABS tablet Take 10 mg by mouth daily.     Multiple Vitamin (MULTIVITAMIN WITH MINERALS) TABS Take 1 tablet by mouth daily.     nitroGLYCERIN (NITROSTAT) 0.4 MG SL tablet Place 1 tablet (0.4 mg total) under the tongue every 5 (five) minutes as needed for chest pain. 30 tablet 3   Omega-3 Fatty Acids (FISH OIL) 1200 MG CAPS Take 1 capsule by mouth daily.      omeprazole (PRILOSEC) 20 MG capsule Take 1 capsule (20 mg total) by mouth daily. 90 capsule 2   pioglitazone (ACTOS) 15 MG tablet Take 15 mg by mouth daily.     TURMERIC PO Take 1 tablet by mouth daily.      valsartan-hydrochlorothiazide (DIOVAN-HCT) 320-25 MG per tablet Take 1 tablet by mouth daily before breakfast.     vitamin C (ASCORBIC ACID) 500 MG tablet Take 500 mg by mouth daily.     vitamin E 400 UNIT capsule Take 400 Units by mouth daily.     No current facility-administered medications on file prior to visit.    Cardiovascular studies:  EKG 04/23/2021: Sinus rhythm 56 bpm Nonspecific T-abnormality  Echocardiogram 09/07/2019:  Left ventricle cavity is normal in size and wall thickness. Normal global  wall motion. Normal LV systolic function with visual EF 50-55%. Doppler  evidence of grade II (pseudonormal) diastolic dysfunction, elevated LAP.  Mild to moderate mitral regurgitation.  Mild tricuspid regurgitation. Estimated pulmonary artery systolic pressure  is 25 mmHg.  Trivial pericardial effusion.  Compared to previous study on 0/10/6977, diastolic dysfunction increased  from grade I to II. Trivial pericardial effusion is new.   Carotid artery duplex 07/18/2018: Stenosis in the left  carotid artery bifurcation of (<50%)  with mild heterogeneous plaque. Antegrade right vertebral artery flow. Antegrade left vertebral artery flow. Follow up  is appropriate when clinically indicated.    Coronary angiography 05/23/2018: LM: Normal LAD: Prox-mid 30% stenosis       Small D1 with ostial 90% stenosis LCx: Large, codominant. prox focal 50% stenosis RCA: Medium caliber. Prox 30%, mid 50% stenosis (Both focal) LVEDP 20 mmHg Conclusion: Nonobstructive coronary artery disease Patient's coronary anatomy and mild to moderate nonobstructive disease does not corroborate with her profound echocardiogram abnormalities showing apical ballooning. This is most likely consistent with  stress induced cardiomyopathy.    Recent labs: 01/16/2021: Glucose 87, BUN/Cr 20/0.8. EGFR 68. HbA1C 6.7% Chol 147, TG 147, HDL 43, LDL 75  08/14/2019: Glucose 231, BUN/Cr 22/0.9. EGFR 60. HbA1C 8.3% Chol 137, TG 183, HDL 39, LDL 61 TSH 1.4 normal   Review of Systems  Cardiovascular:  Positive for dyspnea on exertion and leg swelling. Negative for chest pain, palpitations and syncope.       Vitals:   04/23/21 1405  BP: 135/65  Pulse: (!) 56  Resp: 16  Temp: 98 F (36.7 C)  SpO2: 98%     Body mass index is 27.15 kg/m. Filed Weights   04/23/21 1405  Weight: 139 lb (63 kg)     Objective:   Physical Exam Vitals and nursing note reviewed.  Constitutional:      Appearance: She is well-developed.  Neck:     Vascular: No JVD.  Cardiovascular:     Rate and Rhythm: Normal rate and regular rhythm.     Pulses: Intact distal pulses.     Heart sounds: Normal heart sounds. No murmur heard. Pulmonary:     Effort: Pulmonary effort is normal.     Breath sounds: Normal breath sounds. No wheezing or rales.  Musculoskeletal:     Right lower leg: Edema (Trace.  Prominent varicosities seen.) present.     Left lower leg: Edema (Trace.  Prominent varicosities seen.) present.        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  Leg edema: Physical exam is underwhelming for heart failure.  I suspect her leg edema could be due to venous insufficiency.  We will obtain echocardiogram to rule out significant cardiomyopathy.  Refer to radiology for venous insufficiency testing and management.  Stress cardiomyopathy: Resolved. EF 60-65% in 08/2018.  CAD: Nonobstructive. Continue aspirin and statin.    Hypertension: Well controlled  Hyperlipidemia: Continue Lipitor 40 mg  Nigel Mormon, MD [facility] [place] Pager:  [phone] Office: [phone] If no answer Cell [phone]

## 2021-05-05 ENCOUNTER — Ambulatory Visit: Payer: Medicare Other

## 2021-05-05 ENCOUNTER — Other Ambulatory Visit: Payer: Self-pay

## 2021-05-05 DIAGNOSIS — R0609 Other forms of dyspnea: Secondary | ICD-10-CM

## 2021-05-05 DIAGNOSIS — R600 Localized edema: Secondary | ICD-10-CM

## 2021-06-03 ENCOUNTER — Other Ambulatory Visit: Payer: Self-pay | Admitting: Cardiology

## 2021-06-03 DIAGNOSIS — R600 Localized edema: Secondary | ICD-10-CM

## 2021-06-04 ENCOUNTER — Other Ambulatory Visit: Payer: Self-pay | Admitting: Cardiology

## 2021-06-04 DIAGNOSIS — R600 Localized edema: Secondary | ICD-10-CM

## 2021-09-25 ENCOUNTER — Other Ambulatory Visit: Payer: Self-pay | Admitting: Cardiology

## 2021-09-25 DIAGNOSIS — R600 Localized edema: Secondary | ICD-10-CM

## 2021-10-12 ENCOUNTER — Other Ambulatory Visit: Payer: Self-pay | Admitting: Cardiology

## 2021-10-12 DIAGNOSIS — R600 Localized edema: Secondary | ICD-10-CM

## 2021-12-14 IMAGING — MG MM DIGITAL SCREENING BILAT W/ TOMO AND CAD
6 of 10 series · 6 of 30 positions shown · non-contrast
Comparison: Previous exam(s).

CLINICAL DATA: Screening.

EXAM:
DIGITAL SCREENING BILATERAL MAMMOGRAM WITH TOMOSYNTHESIS AND CAD
TECHNIQUE: Bilateral screening digital craniocaudal and mediolateral oblique
mammograms were obtained. Bilateral screening digital breast
tomosynthesis was performed. The images were evaluated with
computer-aided detection.

[R XCCL synth-2D]
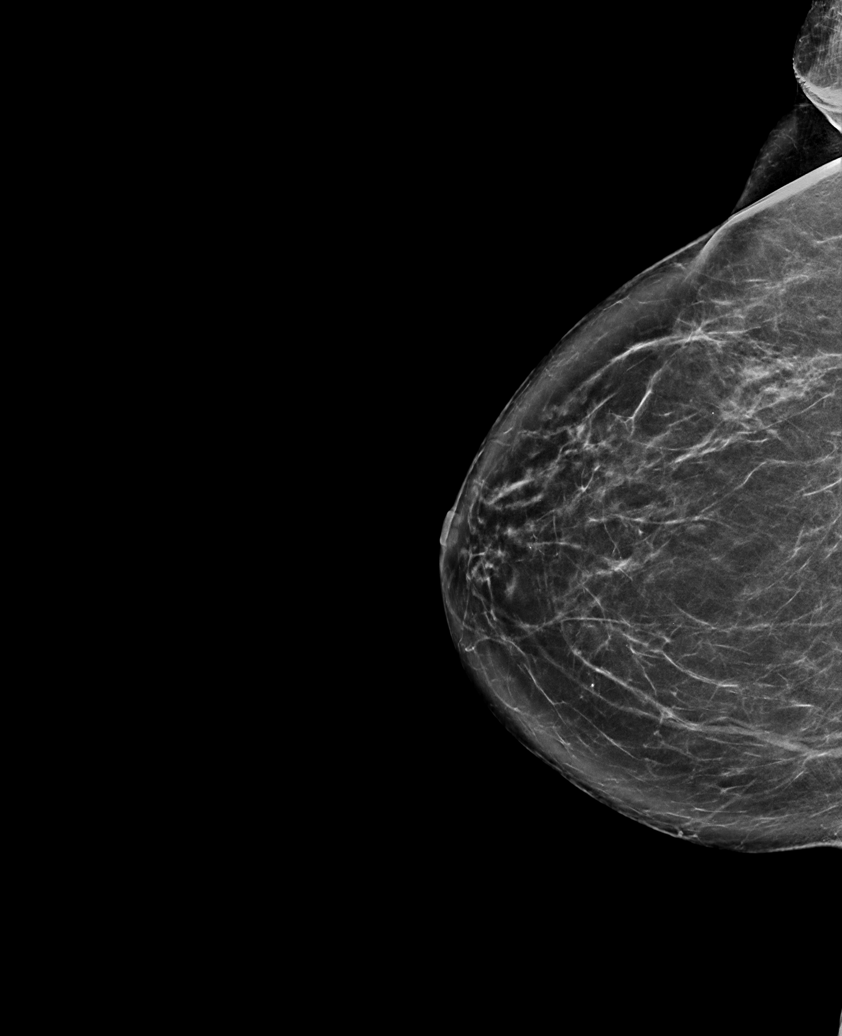

[L MLO synth-2D]
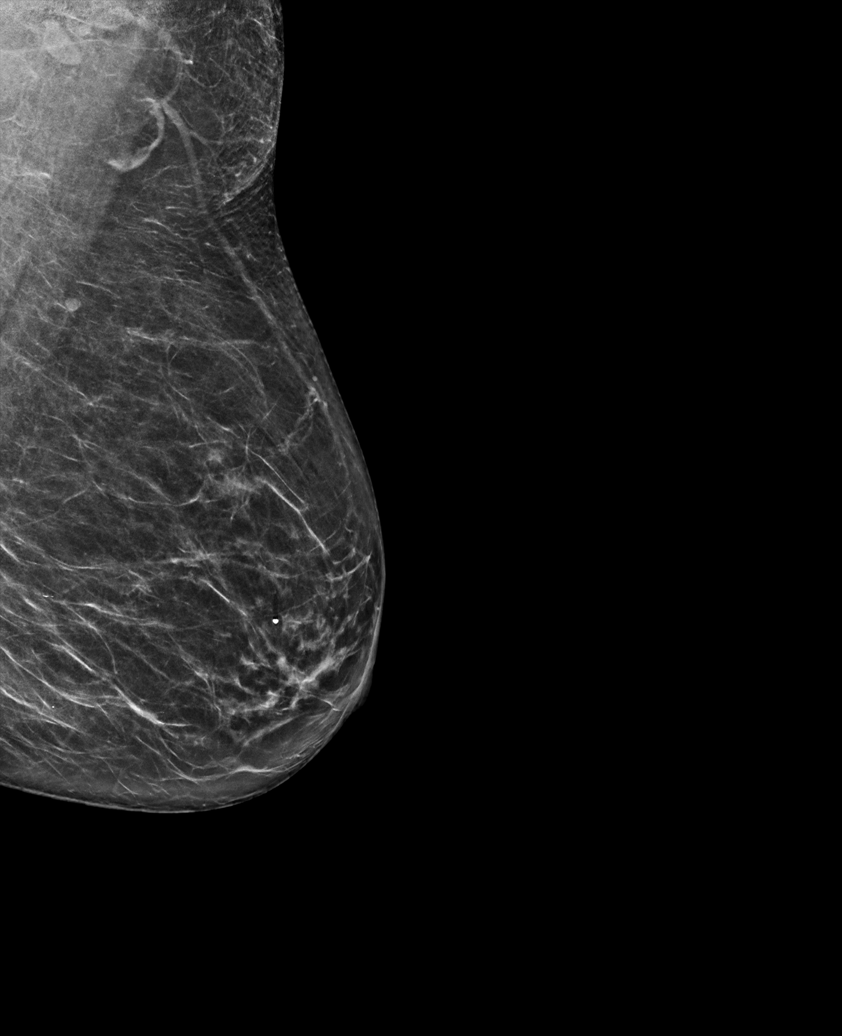

[R MLO synth-2D]
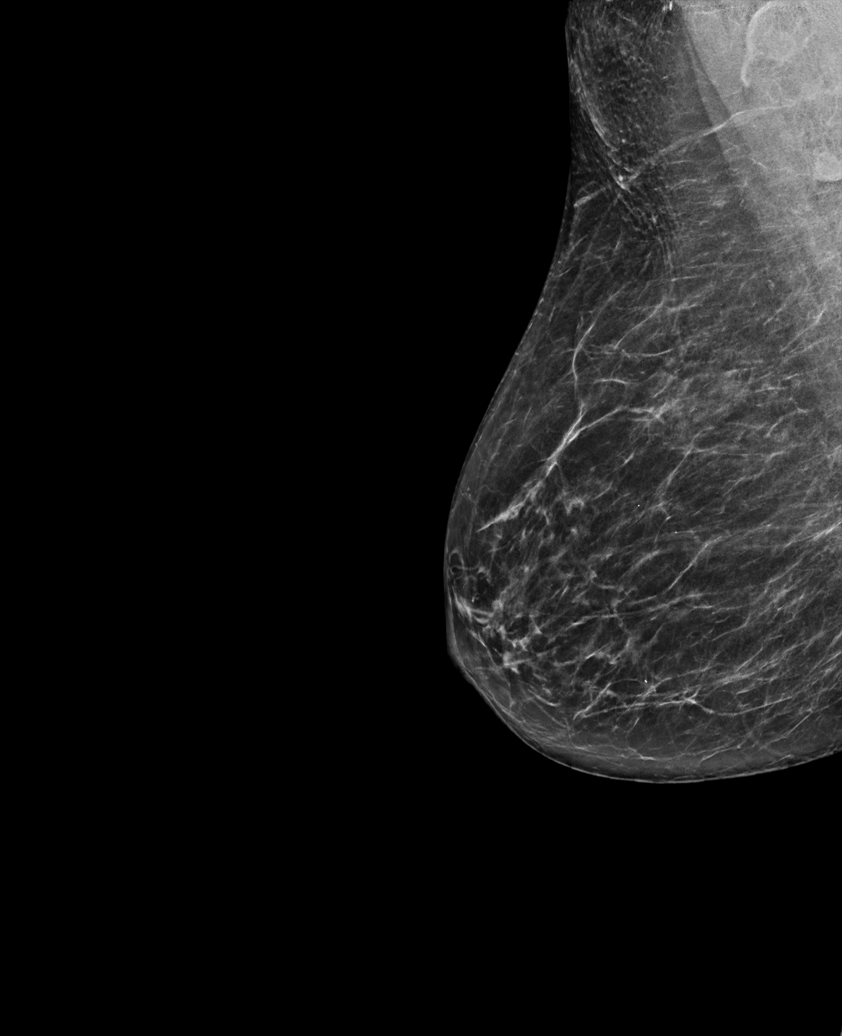

[R CC synth-2D]
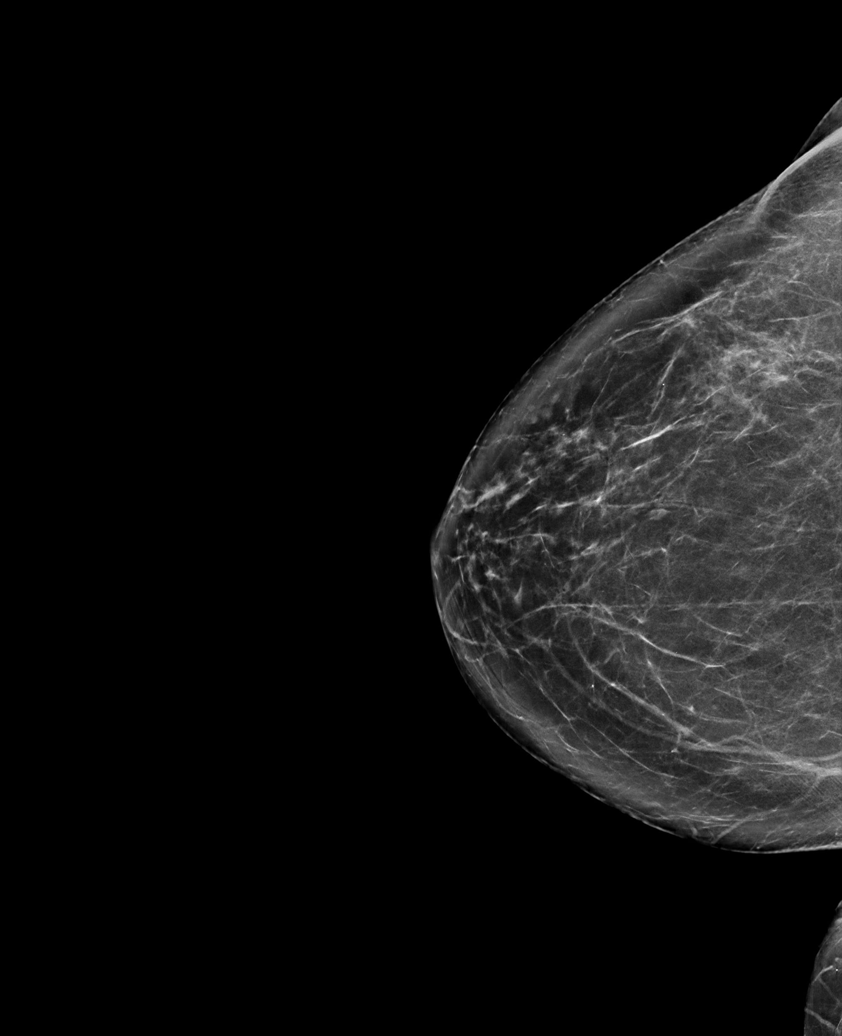

[L CC synth-2D]
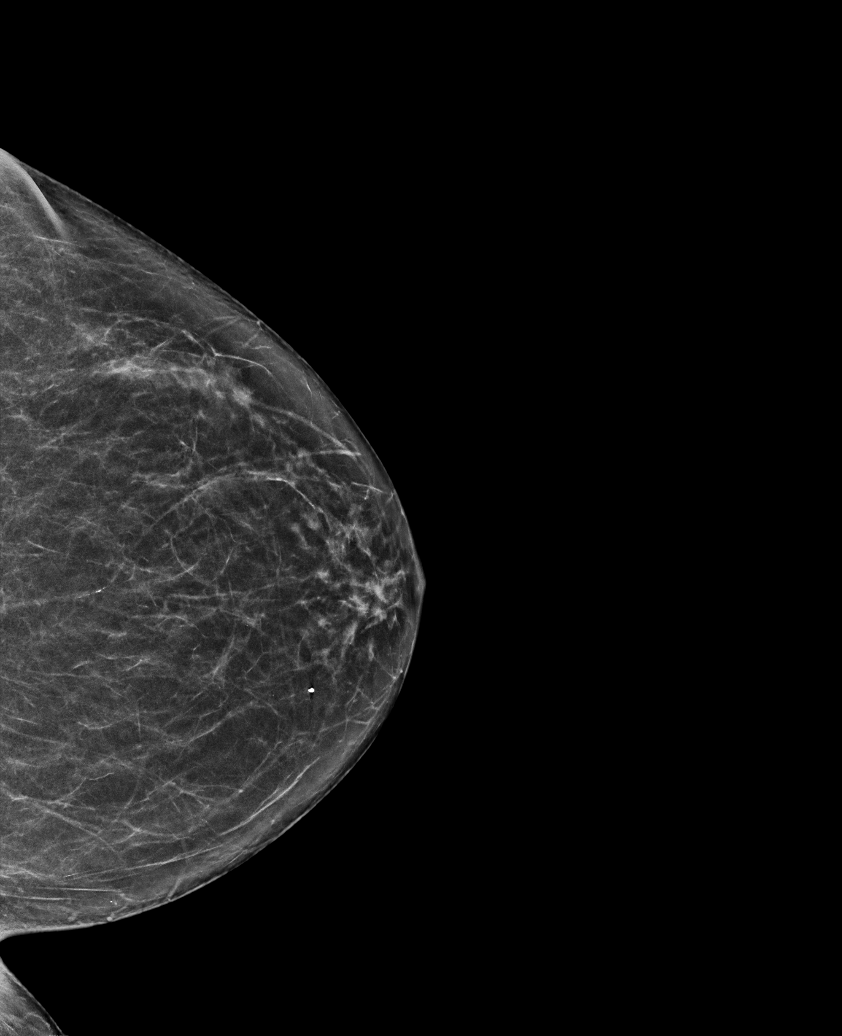

[R XCCL tomo · tomo slice 35/70.0]
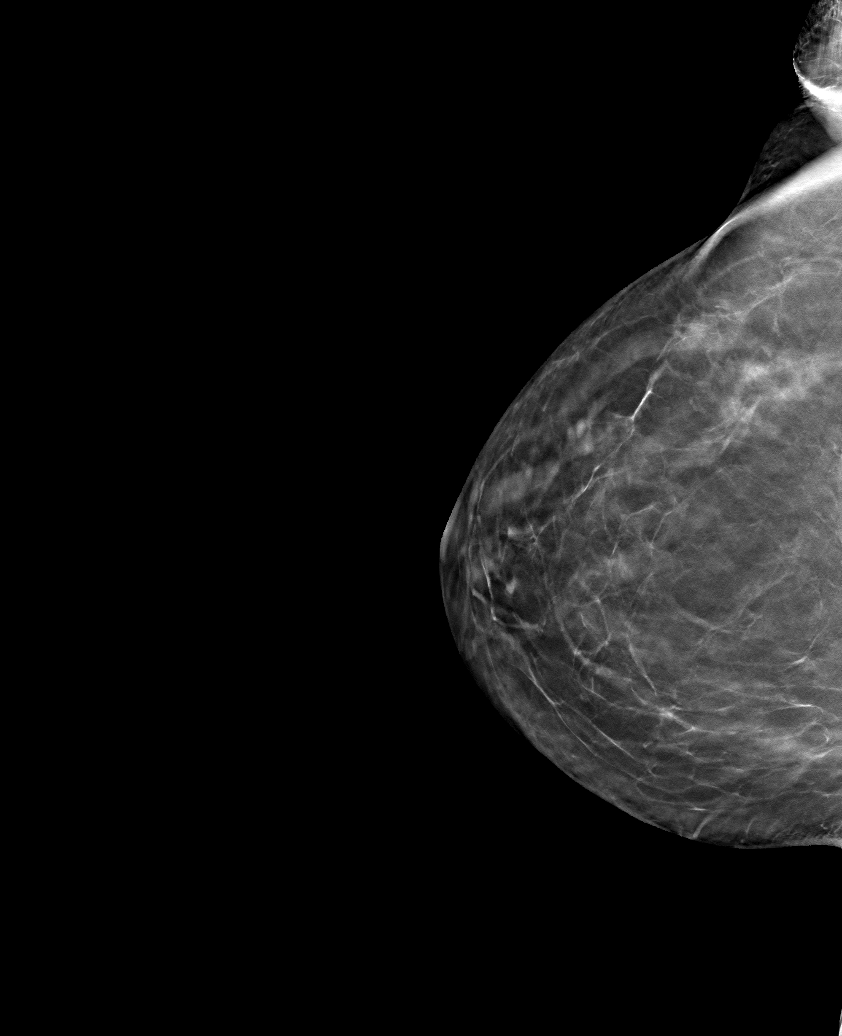

[6 of 30 positions shown; findings below may reference images not displayed]

ACR Breast Density Category b: There are scattered areas of
fibroglandular density.
FINDINGS: There are no findings suspicious for malignancy.
IMPRESSION: No mammographic evidence of malignancy. A result letter of this
screening mammogram will be mailed directly to the patient.

RECOMMENDATION:
Screening mammogram in one year. (Code:51-O-LD2)

BI-RADS CATEGORY  1: Negative.

## 2022-03-20 ENCOUNTER — Telehealth: Payer: Self-pay

## 2022-03-20 NOTE — Telephone Encounter (Signed)
Patient called to inform us that she fall last night and has been feeling droopy, foggy, and dizzy. Patient mention her bp is 142/69. Patient took her bp again and it was 133/92 with a pulse of 70. Per Dr. Virgina Jock patient should go to the ER or to PCP. Patient understood and will got to ER due that she is in [place], [place]

## 2022-03-24 NOTE — Telephone Encounter (Signed)
Very sorry to hear that. Thank you for informing me.   MJP

## 2022-03-24 NOTE — Telephone Encounter (Signed)
Patient is followed in RPM for BP monitoring.  Spoke with patient's husband today as a follow-up. Patient has had a stroke and is at [facility] in [place], [place] under the care of neurologist Dr. Dan Humphreys.  Patient is supposed to be transferred to assisted living/rehab today as she has left-sided deficits.

## 2022-10-22 ENCOUNTER — Encounter: Payer: Self-pay | Admitting: Diagnostic Neuroimaging

## 2022-10-22 ENCOUNTER — Ambulatory Visit: Payer: Medicare Other | Admitting: Diagnostic Neuroimaging

## 2022-10-22 VITALS — BP 125/59 | HR 57 | Ht 59.0 in | Wt 125.0 lb

## 2022-10-22 DIAGNOSIS — Z8673 Personal history of transient ischemic attack (TIA), and cerebral infarction without residual deficits: Secondary | ICD-10-CM

## 2022-10-22 NOTE — Patient Instructions (Signed)
   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

## 2022-10-22 NOTE — Progress Notes (Signed)
[facility]  PATIENT: Paige [name] DOB: 1941-02-18  REFERRING CLINICIAN: Gertie Gowda, MD HISTORY FROM: patient  REASON FOR VISIT: new consult / established   HISTORICAL  CHIEF COMPLAINT:  Chief Complaint  Patient presents with   New Patient (Initial Visit)    Pt with husband, rm 6. States they live part time in [place] they live other time here. Went to hospital October- stayed 3 mths after dc in rehab. She has been receiving rehab for about 2 months. They have not followed with neurologist since her having stroke.  The completed a scan in the hospital. She had a fall 2.5 weeks ago. Has not seen someone since the fall.     HISTORY OF PRESENT ILLNESS:   UPDATE (10/22/22, VRP): Since last visit, memory stable, but then had right MCA stroke in October 2023 [facility]). Was treated with dual antiplatelet (aspirin, plavix) and has continued. Doing outpatient PT / OT. Planning to stay in [facility] for a while (has son and daughter in law there also).  PRIOR HPI ([id number]): 82 year old female here for evaluation of mild memory loss.  Patient reports at least 1 year of mild memory loss, such as losing items at home.  No major changes in ADLs.  She stopped driving 4 years ago because she felt nervous on the road with high amounts of traffic.  Still able to manage household affairs and large holiday meals.  Takes care of her own ADLs.   REVIEW OF SYSTEMS: Full 14 system review of systems performed and negative with exception of: as per HPI.    ALLERGIES: Allergies  Allergen Reactions   Oxycodone Nausea Only    SEVERE NAUSEA   Metformin     Other reaction(s): diarrhea   Chlorhexidine Itching    ITCHING   Hydrocodone Nausea Only    SEVERE NAUSEA   Other Rash    MANGOS -RASH LIKE POISON IVY CASHEWS- MOUTH SWELLS POISON IVY-RASH NOTE:  PT STATES SHE CAN TAKE TRAMADOL FOR PAIN   Sulfa Antibiotics Hives    Face bright red    HOME  MEDICATIONS: Outpatient Medications Prior to Visit  Medication Sig Dispense Refill   amLODipine (NORVASC) 5 MG tablet TAKE 1 TABLET BY MOUTH EVERY DAY 90 tablet 0   aspirin EC 81 MG tablet Take 81 mg by mouth daily.     atorvastatin (LIPITOR) 40 MG tablet TAKE 1 TABLET BY MOUTH EVERY DAY 90 tablet 2   B Complex-C (B-COMPLEX WITH VITAMIN C) tablet Take 1 tablet by mouth daily.     BIOTIN PO Take 1 tablet by mouth daily.      calcium-vitamin D (OSCAL WITH D) 500-200 MG-UNIT per tablet Take 1 tablet by mouth daily.     clopidogrel (PLAVIX) 75 MG tablet Take 75 mg by mouth daily.     co-enzyme Q-10 30 MG capsule Take 30 mg by mouth daily.      diclofenac Sodium (VOLTAREN) 1 % GEL SMARTSIG:Sparingly T-DERMAL 4 Times Daily     diphenhydrAMINE (BENADRYL) 25 MG tablet Take 25 mg by mouth. ONE EVERY NIGHT     escitalopram (LEXAPRO) 10 MG tablet Take 5 mg by mouth daily.      furosemide (LASIX) 20 MG tablet TAKE 1 TABLET BY MOUTH EVERY DAY AS NEEDED 30 tablet 0   glucosamine-chondroitin 500-400 MG tablet Take 1 tablet by mouth daily.     JARDIANCE 10 MG TABS tablet Take 10 mg by mouth daily.  Melatonin 5 MG CAPS Take 1 tablet by mouth at bedtime.     metoprolol succinate (TOPROL-XL) 50 MG 24 hr tablet Take 1 tablet by mouth daily.     Multiple Vitamin (MULTIVITAMIN WITH MINERALS) TABS Take 1 tablet by mouth daily.     Omega-3 Fatty Acids (FISH OIL) 1200 MG CAPS Take 1 capsule by mouth daily.     omeprazole (PRILOSEC) 20 MG capsule Take 1 capsule (20 mg total) by mouth daily. 90 capsule 2   pioglitazone (ACTOS) 15 MG tablet Take 1 tablet by mouth daily.     TURMERIC PO Take 1 tablet by mouth daily.      valsartan-hydrochlorothiazide (DIOVAN-HCT) 320-25 MG per tablet Take 1 tablet by mouth daily before breakfast.     vitamin C (ASCORBIC ACID) 500 MG tablet Take 500 mg by mouth daily.     vitamin E 400 UNIT capsule Take 400 Units by mouth daily.     nitroGLYCERIN (NITROSTAT) 0.4 MG SL tablet Place 1  tablet (0.4 mg total) under the tongue every 5 (five) minutes as needed for chest pain. 30 tablet 3   Omega-3 1000 MG CAPS Take by mouth.     omeprazole (PRILOSEC) 20 MG capsule Take 1 capsule by mouth daily.     pioglitazone (ACTOS) 15 MG tablet Take 15 mg by mouth daily.     valsartan-hydrochlorothiazide (DIOVAN-HCT) 320-25 MG tablet Take 1 tablet by mouth daily.     No facility-administered medications prior to visit.    PAST MEDICAL HISTORY: Past Medical History:  Diagnosis Date   Anxiety    Arthritis    Complication of anesthesia    PT STATES THAT SHE WAS ADVISED NOT TO HAVE SPINAL ANESTHESIA FOR HER SECOND KNEE REPLACEMENT SURGERY IN  2011-BECAUSE SHE HAD BACK PROBLEMS-PAIN AND SEVERAL EPIDURALS--STATES DR. Shon Baton ADVISED AGAINST SPINAL .  PT STATES SHE HAD SORE THROAT FOR 3 DAYS AFTER THE GENERAL ANESTHESIA FOR  LAST KNEE REPLACEMENT   Diabetes (HCC)    Diverticulosis    hyperlip   GERD (gastroesophageal reflux disease)    PAST HX--NO PROBLEMS NOW-WATCHES DIET   Hearing loss    HAS BIL HEARING AIDS   Hepatic steatosis 04/17/10   Hx of adenomatous colonic polyps    Hyperlipemia    Hypertension    Osteopenia    Pain    LEFT KNEE    PAST SURGICAL HISTORY: Past Surgical History:  Procedure Laterality Date   ABDOMINAL HYSTERECTOMY  1980   APPENDECTOMY     JOINT REPLACEMENT     BILATERAL KNEE REPLACEMENTS   KNEE ARTHROSCOPY  05/25/2012   Procedure: ARTHROSCOPY KNEE;  Surgeon: Loanne Drilling, MD;  Location: [facility];  Service: Orthopedics;  Laterality: Left;  SYNOVECTOMY   LEFT HEART CATH AND CORONARY ANGIOGRAPHY N/A 05/23/2018   Procedure: LEFT HEART CATH AND CORONARY ANGIOGRAPHY;  Surgeon: Elder Negus, MD;  Location: [facility];  Service: Cardiovascular;  Laterality: N/A;   TONSILLECTOMY     AND ADNOIDECTOMY    FAMILY HISTORY: Family History  Problem Relation Age of Onset   Diabetes Father    Heart attack Father    Colon cancer Daughter      SOCIAL HISTORY: Social History   Socioeconomic History   Marital status: Married    Spouse name: Normand Sloop   Number of children: 4   Years of education: Not on file   Highest education level: Some college, no degree  Occupational History   Occupation: Futures trader  Tobacco Use   Smoking status: Never   Smokeless tobacco: Never  Vaping Use   Vaping Use: Never used  Substance and Sexual Activity   Alcohol use: Never   Drug use: Never   Sexual activity: Not on file  Other Topics Concern   Not on file  Social History Narrative   11/05/20 lives with husband   Caffeine 1 12 oz daily   Social Determinants of Health   Financial Resource Strain: Not on file  Food Insecurity: Not on file  Transportation Needs: Not on file  Physical Activity: Not on file  Stress: Not on file  Social Connections: Not on file  Intimate Partner Violence: Not on file     PHYSICAL EXAM  GENERAL EXAM/CONSTITUTIONAL: Vitals:  Vitals:   10/22/22 1409  BP: (!) 125/59  Pulse: (!) 57  Weight: 125 lb (56.7 kg)  Height: 4\' 11"  (1.499 m)   Body mass index is 25.25 kg/m. Wt Readings from Last 3 Encounters:  10/22/22 125 lb (56.7 kg)  04/23/21 139 lb (63 kg)  11/05/20 142 lb 9.6 oz (64.7 kg)   Patient is in no distress; well developed, nourished and groomed; neck is supple  CARDIOVASCULAR: Examination of carotid arteries is normal; no carotid bruits Regular rate and rhythm, no murmurs Examination of peripheral vascular system by observation and palpation is normal  EYES: Ophthalmoscopic exam of optic discs and posterior segments is normal; no papilledema or hemorrhages No results found.  MUSCULOSKELETAL: Gait, strength, tone, movements noted in Neurologic exam below  NEUROLOGIC: MENTAL STATUS:     11/05/2020    2:08 PM  MMSE - Mini Mental State Exam  Orientation to time 5  Orientation to Place 5  Registration 3  Attention/ Calculation 2  Recall 3  Language- name 2 objects 2   Language- repeat 1  Language- follow 3 step command 3  Language- read & follow direction 1  Write a sentence 1  Copy design 0  Total score 26   awake, alert, oriented to person, place and time recent and remote memory intact normal attention and concentration language fluent, comprehension intact, naming intact fund of knowledge appropriate  CRANIAL NERVE:  2nd - no papilledema on fundoscopic exam 2nd, 3rd, 4th, 6th - pupils equal and reactive to light, visual fields full to confrontation, extraocular muscles intact, no nystagmus 5th - facial sensation symmetric 7th - facial strength symmetric 8th - hearing intact 9th - palate elevates symmetrically, uvula midline 11th - shoulder shrug symmetric 12th - tongue protrusion midline MILD DYSARTHRIA  MOTOR:  normal bulk and tone, full strength in the RUE, RLE LUE 3; LLE 3-4  SENSORY:  normal and symmetric to light touch, temperature, vibration; EXCEPT ABSENT VIB IN LEFT FOOT  COORDINATION:  finger-nose-finger, fine finger movements normal  REFLEXES:  deep tendon reflexes BRISK IN LUE; OTHERWISE 1  GAIT/STATION:  IN WHEELCHAIR     DIAGNOSTIC DATA (LABS, IMAGING, TESTING) - I reviewed patient records, labs, notes, testing and imaging myself where available.  Lab Results  Component Value Date   WBC 7.5 05/24/2018   HGB 12.9 05/24/2018   HCT 39.3 05/24/2018   MCV 91.6 05/24/2018   PLT 258 05/24/2018      Component Value Date/Time   NA 139 05/24/2018 0316   K 3.8 05/24/2018 0316   CL 105 05/24/2018 0316   CO2 22 05/24/2018 0316   GLUCOSE 141 (H) 05/24/2018 0316   BUN 12 05/24/2018 0316   CREATININE 0.81 05/24/2018 0316  CALCIUM 9.2 05/24/2018 0316   PROT 7.0 05/21/2018 1431   ALBUMIN 4.1 05/21/2018 1431   AST 44 (H) 05/21/2018 1431   ALT 35 05/21/2018 1431   ALKPHOS 65 05/21/2018 1431   BILITOT 0.7 05/21/2018 1431   GFRNONAA >60 05/24/2018 0316   GFRAA >60 05/24/2018 0316   Lab Results  Component  Value Date   CHOL 172 05/22/2018   HDL 41 05/22/2018   LDLCALC 84 05/22/2018   TRIG 236 (H) 05/22/2018   CHOLHDL 4.2 05/22/2018   Lab Results  Component Value Date   HGBA1C 7.2 (H) 05/22/2018   No results found for: "VITAMINB12" No results found for: "TSH"  03/21/22 TTE 1. The left ventricle is normal in size with normal wall thickness.    2. The left ventricular systolic function is normal, LVEF is visually  estimated at 60-65%.    3. There is grade I diastolic dysfunction (impaired relaxation).    4. The mitral valve leaflets are mildly thickened with normal leaflet  mobility.   5. Mitral annular calcification is present (mild).    6. The aortic valve is trileaflet with mildly thickened leaflets with normal  excursion.   7. There is no thrombus seen in the left atrium.    8. The right ventricle is upper normal in size, with normal systolic  function.   9. There is no pulmonary hypertension.    10. There is no thrombus seen in the right atrium.    11. There is no evidence of an interatrial flow communication or  intrapulmonary shunt by agitated saline study.   03/20/22 CTA head / neck - Complete occlusion involving the M2 segment of the right middle cerebral artery.   03/22/22 CT head - Evolution of previously visualized right middle cerebral artery occlusion/infarction. No significant mass effect or evidence for hemorrhagic transformation.    ASSESSMENT AND PLAN  82 y.o. year old female here with:  Dx:  1. 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    Suanne Marker, MD 10/22/2022, 2:29 PM Certified in Neurology, Neurophysiology and Neuroimaging  [facility] [address] [place], [place] [address] [phone]

## 2022-10-29 ENCOUNTER — Encounter: Payer: Self-pay | Admitting: Cardiology

## 2022-10-29 ENCOUNTER — Other Ambulatory Visit: Payer: Medicare Other

## 2022-10-29 ENCOUNTER — Ambulatory Visit: Payer: Medicare Other | Admitting: Cardiology

## 2022-10-29 VITALS — BP 111/64 | HR 56 | Resp 16 | Ht 59.0 in | Wt 126.0 lb

## 2022-10-29 DIAGNOSIS — I639 Cerebral infarction, unspecified: Secondary | ICD-10-CM

## 2022-10-29 DIAGNOSIS — I10 Essential (primary) hypertension: Secondary | ICD-10-CM

## 2022-10-29 DIAGNOSIS — E782 Mixed hyperlipidemia: Secondary | ICD-10-CM

## 2022-10-29 NOTE — Progress Notes (Signed)
Follow up visit   Subjective:   Paige [name], female    DOB: 07/05/1940, 82 y.o.   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 Patient spent 3 months at the rehab.  She recently saw neurologist Dr. Marjory Lies here, who recommended Zio patch followed by loop recorder as needed.  Patient has gained much of her motor strength back, but continues to have issues holding objects with her left hand. She has not been able to cook, as a result, and has resorted to drive throughs for meals.    She denies chest pain, shortness of breath, palpitations, leg edema, orthopnea, PND, TIA/syncope.     Current Outpatient Medications:    amLODipine (NORVASC) 5 MG tablet, TAKE 1 TABLET BY MOUTH EVERY DAY, Disp: 90 tablet, Rfl: 0   aspirin EC 81 MG tablet, Take 81 mg by mouth daily., Disp: , Rfl:    atorvastatin (LIPITOR) 40 MG tablet, TAKE 1 TABLET BY MOUTH EVERY DAY, Disp: 90 tablet, Rfl: 2   B Complex-C (B-COMPLEX WITH VITAMIN C) tablet, Take 1 tablet by mouth daily., Disp: , Rfl:    BIOTIN PO, Take 1 tablet by mouth daily. , Disp: , Rfl:    calcium-vitamin D (OSCAL WITH D) 500-200 MG-UNIT per tablet, Take 1 tablet by mouth daily., Disp: , Rfl:    clopidogrel (PLAVIX) 75 MG tablet, Take 75 mg by mouth daily., Disp: , Rfl:    co-enzyme Q-10 30 MG capsule, Take 30 mg by mouth daily. , Disp: , Rfl:    diclofenac Sodium (VOLTAREN) 1 % GEL, SMARTSIG:Sparingly T-DERMAL 4 Times Daily, Disp: , Rfl:    diphenhydrAMINE (BENADRYL) 25 MG tablet, Take 25 mg by mouth. ONE EVERY NIGHT, Disp: , Rfl:    escitalopram (LEXAPRO) 10 MG tablet, Take 5 mg by mouth daily. , Disp: , Rfl:     furosemide (LASIX) 20 MG tablet, TAKE 1 TABLET BY MOUTH EVERY DAY AS NEEDED, Disp: 30 tablet, Rfl: 0   glucosamine-chondroitin 500-400 MG tablet, Take 1 tablet by mouth daily., Disp: , Rfl:    JARDIANCE 10 MG TABS tablet, Take 10 mg by mouth daily., Disp: , Rfl:    Melatonin 5 MG CAPS, Take 1 tablet by mouth at bedtime., Disp: , Rfl:    metoprolol succinate (TOPROL-XL) 50 MG 24 hr tablet, Take 1 tablet by mouth daily., Disp: , Rfl:    Multiple Vitamin (MULTIVITAMIN WITH MINERALS) TABS, Take 1 tablet by mouth daily., Disp: , Rfl:    nitroGLYCERIN (NITROSTAT) 0.4 MG SL tablet, Place 1 tablet (0.4 mg total) under the tongue every 5 (five) minutes as needed for chest pain., Disp: 30 tablet, Rfl: 3   Omega-3 Fatty Acids (FISH OIL) 1200 MG CAPS, Take 1 capsule by mouth daily., Disp: , Rfl:    omeprazole (PRILOSEC) 20 MG capsule, Take 1 capsule (20 mg total) by mouth daily., Disp: 90 capsule, Rfl: 2   pioglitazone (ACTOS) 15 MG tablet, Take 1 tablet by mouth daily., Disp: , Rfl:    TURMERIC PO, Take 1 tablet by mouth daily. , Disp: , Rfl:    valsartan-hydrochlorothiazide (DIOVAN-HCT) 320-25 MG per tablet, Take 1 tablet by mouth daily before breakfast., Disp: , Rfl:  vitamin C (ASCORBIC ACID) 500 MG tablet, Take 500 mg by mouth daily., Disp: , Rfl:    vitamin E 400 UNIT capsule, Take 400 Units by mouth daily., Disp: , Rfl:    Cardiovascular studies:  EKG 10/29/2022: Sinus rhythm 52 bpm Nonspecific T wave abnormality  CT head 03/22/2022: Evolution of previously visualized right middle cerebral artery occlusion/infarction. No significant mass effect or evidence for hemorrhagic transformation.   Echocardiogram 03/21/2022:    1. The left ventricle is normal in size with normal wall thickness.    2. The left ventricular systolic function is normal, LVEF is visually  estimated at 60-65%.    3. There is grade I diastolic dysfunction (impaired relaxation).    4. The mitral valve leaflets are mildly  thickened with normal leaflet  mobility.   5. Mitral annular calcification is present (mild).    6. The aortic valve is trileaflet with mildly thickened leaflets with normal  excursion.   7. There is no thrombus seen in the left atrium.    8. The right ventricle is upper normal in size, with normal systolic  function.   9. There is no pulmonary hypertension.    10. There is no thrombus seen in the right atrium.    11. There is no evidence of an interatrial flow communication or  intrapulmonary shunt by agitated saline study.    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 Antegrade right vertebral artery flow. Antegrade left vertebral artery flow. Follow up  is appropriate when clinically indicated.    Coronary angiography 05/23/2018: LM: Normal LAD: Prox-mid 30% stenosis       Small D1 with ostial 90% stenosis LCx: Large, codominant. prox focal 50% stenosis RCA: Medium caliber. Prox 30%, mid 50% stenosis (Both focal) LVEDP 20 mmHg Conclusion: Nonobstructive coronary artery disease Patient's coronary anatomy and mild to moderate nonobstructive disease does not corroborate with her profound echocardiogram abnormalities showing apical ballooning. This is most likely consistent with stress induced cardiomyopathy.    Recent labs: 07/07/2022: HbA1C 7.5%  03/22/2022: Glucose 154, BUN/Cr 18/0.7. EGFR 86. Na/K 136/3.6. Rest of the CMP normal H/H 14/41. MCV 89. Platelets 267 Chol 190, TG 136, HDL 49, LDL 114  01/16/2021: Glucose 87, BUN/Cr 20/0.8. EGFR 68. HbA1C 6.7% Chol 147, TG 147, HDL 43, LDL 75   Review of Systems  Cardiovascular:  Positive for dyspnea on exertion and leg swelling. Negative for chest pain, palpitations and syncope.        Vitals:   10/29/22 1056  BP: 111/64  Pulse: (!) 56  Resp: 16  SpO2: 92%     Body mass index is 25.45 kg/m. Filed Weights   10/29/22 1056   Weight: 126 lb (57.2 kg)     Objective:   Physical Exam Vitals and nursing note reviewed.  Constitutional:      Appearance: She is well-developed.  Neck:     Vascular: No JVD.  Cardiovascular:     Rate and Rhythm: Normal rate and regular rhythm.     Pulses: Intact distal pulses.     Heart sounds: Normal heart sounds. No murmur heard. Pulmonary:     Effort: Pulmonary effort is normal.     Breath sounds: Normal breath sounds. No wheezing or rales.  Musculoskeletal:     Right lower leg: No edema.     Left lower leg: No edema.  Neurological:     Motor: Weakness (Left sided weakness) present.  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 Recommend 2 week live cardiac telemetry. If not Afib found, would recommend implantable loop recorder.   Stress cardiomyopathy: Resolved. EF 60-65% in 08/2018.  CAD: Nonobstructive. Continue aspirin and statin.    Hypertension: Well controlled  Mixed hyperlipidemia: Continue Lipitor 40 mg Chol 190, TG 136, HDL 49, LDL 114 (03/2022). Repeat lipid panel. If LDL >70, increase Lipitor to 80 mg daily.  Elder Negus, MD [facility] Pager: [phone] Office: [phone] If no answer Cell [phone]

## 2023-01-29 ENCOUNTER — Encounter: Payer: Self-pay | Admitting: Cardiology

## 2023-01-29 ENCOUNTER — Ambulatory Visit: Payer: Medicare Other | Admitting: Cardiology

## 2023-01-29 VITALS — BP 122/45 | HR 53 | Resp 16 | Ht 59.0 in | Wt 122.6 lb

## 2023-01-29 DIAGNOSIS — E782 Mixed hyperlipidemia: Secondary | ICD-10-CM

## 2023-01-29 DIAGNOSIS — I639 Cerebral infarction, unspecified: Secondary | ICD-10-CM

## 2023-01-29 DIAGNOSIS — I10 Essential (primary) hypertension: Secondary | ICD-10-CM

## 2023-01-29 MED ORDER — ATORVASTATIN CALCIUM 80 MG PO TABS: 80.0000 mg | ORAL_TABLET | Freq: Every day | ORAL | 3 refills | Status: AC

## 2023-01-29 MED ORDER — INCLISIRAN SODIUM 284 MG/1.5ML ~~LOC~~ SOSY
284.0000 mg | PREFILLED_SYRINGE | Freq: Once | SUBCUTANEOUS | Status: DC
Start: 2023-01-29 — End: 2023-01-29

## 2023-01-29 NOTE — Progress Notes (Signed)
Follow up visit   Subjective:   Paige [name], female    DOB: 05/06/1941, 82 y.o.   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  She is continuing physical and occupational therapy since her stroke.  Some improvement in her left-sided weakness.  She denies any chest pain, shortness of breath, palpitation symptoms.  Reviewed cardiac telemetry monitor results with the patient, details below.  Patient is spending time between [place] and here.  She is here for appointment today, but will be going back to [place].  They will be back in [facility] October almost for the fall/winter.     Current Outpatient Medications:    amLODipine (NORVASC) 5 MG tablet, TAKE 1 TABLET BY MOUTH EVERY DAY, Disp: 90 tablet, Rfl: 0   aspirin EC 81 MG tablet, Take 81 mg by mouth daily., Disp: , Rfl:    atorvastatin (LIPITOR) 40 MG tablet, TAKE 1 TABLET BY MOUTH EVERY DAY, Disp: 90 tablet, Rfl: 2   B Complex-C (B-COMPLEX WITH VITAMIN C) tablet, Take 1 tablet by mouth daily., Disp: , Rfl:    BIOTIN PO, Take 1 tablet by mouth daily. , Disp: , Rfl:    calcium-vitamin D (OSCAL WITH D) 500-200 MG-UNIT per tablet, Take 1 tablet by mouth daily., Disp: , Rfl:    clopidogrel (PLAVIX) 75 MG tablet, Take 75 mg by mouth daily., Disp: , Rfl:    co-enzyme Q-10 30 MG capsule, Take 30 mg by mouth daily. , Disp: , Rfl:    diclofenac Sodium (VOLTAREN) 1 % GEL, SMARTSIG:Sparingly T-DERMAL 4 Times Daily, Disp: , Rfl:    diphenhydrAMINE (BENADRYL) 25 MG tablet, Take 25 mg by mouth. ONE EVERY NIGHT, Disp: , Rfl:    escitalopram (LEXAPRO) 10 MG tablet, Take 5 mg by mouth daily. , Disp: , Rfl:    furosemide (LASIX) 20  MG tablet, TAKE 1 TABLET BY MOUTH EVERY DAY AS NEEDED, Disp: 30 tablet, Rfl: 0   glucosamine-chondroitin 500-400 MG tablet, Take 1 tablet by mouth daily., Disp: , Rfl:    JARDIANCE 10 MG TABS tablet, Take 10 mg by mouth daily., Disp: , Rfl:    Melatonin 5 MG CAPS, Take 1 tablet by mouth at bedtime., Disp: , Rfl:    metoprolol succinate (TOPROL-XL) 50 MG 24 hr tablet, Take 1 tablet by mouth daily., Disp: , Rfl:    Multiple Vitamin (MULTIVITAMIN WITH MINERALS) TABS, Take 1 tablet by mouth daily., Disp: , Rfl:    nitroGLYCERIN (NITROSTAT) 0.4 MG SL tablet, Place 1 tablet (0.4 mg total) under the tongue every 5 (five) minutes as needed for chest pain., Disp: 30 tablet, Rfl: 3   Omega-3 Fatty Acids (FISH OIL) 1200 MG CAPS, Take 1 capsule by mouth daily., Disp: , Rfl:    omeprazole (PRILOSEC) 20 MG capsule, Take 1 capsule (20 mg total) by mouth daily., Disp: 90 capsule, Rfl: 2   pioglitazone (ACTOS) 15 MG tablet, Take 1 tablet by mouth daily., Disp: , Rfl:    TURMERIC PO, Take 1 tablet by mouth daily. , Disp: , Rfl:    valsartan-hydrochlorothiazide (DIOVAN-HCT) 320-25 MG per tablet, Take 1 tablet by mouth daily before breakfast., Disp: , Rfl:    vitamin C (ASCORBIC ACID) 500  MG tablet, Take 500 mg by mouth daily., Disp: , Rfl:    vitamin E 400 UNIT capsule, Take 400 Units by mouth daily., Disp: , Rfl:    Cardiovascular studies:  EKG 10/29/2022: Sinus rhythm 52 bpm Nonspecific T wave abnormality  Mobile cardiac telemetry 14 days 10/29/2022 - 11/12/2022: Dominant rhythm: Sinus. HR 47-79 bpm. Avg HR 6 bpm. 17 episodes of atrial tachycardia, fastest at 150 bpm for 6 beats, longest for 13 beats at 116 bpm. <1% isolated SVE,  couplet/triplets. 0 episodes of VT. <1% isolated VE, couplets. No atrial fibrillation/atrial flutter/VT/high grade AV block, sinus pause >3sec noted. 0 patient triggered events.     CT head 03/22/2022: Evolution of previously visualized right middle cerebral artery  occlusion/infarction. No significant mass effect or evidence for hemorrhagic transformation.   Echocardiogram 03/21/2022:    1. The left ventricle is normal in size with normal wall thickness.    2. The left ventricular systolic function is normal, LVEF is visually  estimated at 60-65%.    3. There is grade I diastolic dysfunction (impaired relaxation).    4. The mitral valve leaflets are mildly thickened with normal leaflet  mobility.   5. Mitral annular calcification is present (mild).    6. The aortic valve is trileaflet with mildly thickened leaflets with normal  excursion.   7. There is no thrombus seen in the left atrium.    8. The right ventricle is upper normal in size, with normal systolic  function.   9. There is no pulmonary hypertension.    10. There is no thrombus seen in the right atrium.    11. There is no evidence of an interatrial flow communication or  intrapulmonary shunt by agitated saline study.    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 Antegrade right vertebral artery flow. Antegrade left vertebral artery flow. Follow up  is appropriate when clinically indicated.    Coronary angiography 05/23/2018: LM: Normal LAD: Prox-mid 30% stenosis       Small D1 with ostial 90% stenosis LCx: Large, codominant. prox focal 50% stenosis RCA: Medium caliber. Prox 30%, mid 50% stenosis (Both focal) LVEDP 20 mmHg Conclusion: Nonobstructive coronary artery disease Patient's coronary anatomy and mild to moderate nonobstructive disease does not corroborate with her profound echocardiogram abnormalities showing apical ballooning. This is most likely consistent with stress induced cardiomyopathy.    Recent labs: 11/03/2022: Chol 173, TG 161, HDL 44, LDL 97  07/07/2022: HbA1C 7.5%  03/22/2022: Glucose 154, BUN/Cr 18/0.7. EGFR 86. Na/K 136/3.6. Rest of the CMP normal H/H 14/41. MCV 89.  Platelets 267 Chol 190, TG 136, HDL 49, LDL 114  01/16/2021: Glucose 87, BUN/Cr 20/0.8. EGFR 68. HbA1C 6.7% Chol 147, TG 147, HDL 43, LDL 75   Review of Systems  Cardiovascular:  Positive for dyspnea on exertion and leg swelling. Negative for chest pain, palpitations and syncope.        Vitals:   01/29/23 1308  BP: (!) 122/45  Pulse: (!) 53  Resp: 16  SpO2: 93%      Body mass index is 24.76 kg/m. Filed Weights   01/29/23 1308  Weight: 122 lb 9.6 oz (55.6 kg)      Objective:   Physical Exam Vitals and nursing note reviewed.  Constitutional:      Appearance: She is well-developed.  Neck:     Vascular: No JVD.  Cardiovascular:     Rate and Rhythm: Normal  rate and regular rhythm.     Pulses: Intact distal pulses.     Heart sounds: Normal heart sounds. No murmur heard. Pulmonary:     Effort: Pulmonary effort is normal.     Breath sounds: Normal breath sounds. No wheezing or rales.  Musculoskeletal:     Right lower leg: No edema.     Left lower leg: No edema.  Neurological:     Motor: Weakness (Left sided weakness) present.        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 No Afib noted on 2 week live cardiac telemetry. Ideally, recommend loop recorder placement to look for occult A-fib.  [place] is going back to [place] and will be back in [place] starting October for the fall/winter.  Will need to arrange to require around that time.  Stress cardiomyopathy: Resolved. EF 60-65% in 08/2018.  CAD: Nonobstructive. Continue aspirin and statin.    Hypertension: Well controlled  Mixed hyperlipidemia: Chol 173, TG 161, HDL 44, LDL 97 (10/2022). Increase Lipitor to 80 mg daily.  F/u in 6 months    Elder Negus, MD Pager:  [phone] Office: [phone]
